# Patient Record
Sex: Female | Born: 1965
Health system: Southern US, Community
[De-identification: ages and names within clinical notes are randomized; demographics above are authoritative.]

## PROBLEM LIST (undated history)

## (undated) DIAGNOSIS — D134 Benign neoplasm of liver: Secondary | ICD-10-CM

## (undated) DIAGNOSIS — K8020 Calculus of gallbladder without cholecystitis without obstruction: Secondary | ICD-10-CM

## (undated) HISTORY — DX: Calculus of gallbladder without cholecystitis without obstruction: K80.20

## (undated) HISTORY — DX: Benign neoplasm of liver: D13.4

## (undated) HISTORY — PX: LIVER BIOPSY: SHX301

---

## 1997-07-26 ENCOUNTER — Inpatient Hospital Stay (HOSPITAL_COMMUNITY): Admission: AD | Admit: 1997-07-26 | Discharge: 1997-07-28 | Payer: Self-pay | Admitting: Obstetrics and Gynecology

## 2000-06-03 ENCOUNTER — Inpatient Hospital Stay (HOSPITAL_COMMUNITY): Admission: AD | Admit: 2000-06-03 | Discharge: 2000-06-05 | Payer: Self-pay | Admitting: Obstetrics and Gynecology

## 2000-07-11 ENCOUNTER — Other Ambulatory Visit: Admission: RE | Admit: 2000-07-11 | Discharge: 2000-07-11 | Payer: Self-pay | Admitting: Obstetrics and Gynecology

## 2001-07-16 ENCOUNTER — Other Ambulatory Visit: Admission: RE | Admit: 2001-07-16 | Discharge: 2001-07-16 | Payer: Self-pay | Admitting: Obstetrics and Gynecology

## 2002-08-12 ENCOUNTER — Other Ambulatory Visit: Admission: RE | Admit: 2002-08-12 | Discharge: 2002-08-12 | Payer: Self-pay | Admitting: Obstetrics and Gynecology

## 2003-09-17 ENCOUNTER — Other Ambulatory Visit: Admission: RE | Admit: 2003-09-17 | Discharge: 2003-09-17 | Payer: Self-pay | Admitting: Obstetrics and Gynecology

## 2004-11-03 ENCOUNTER — Other Ambulatory Visit: Admission: RE | Admit: 2004-11-03 | Discharge: 2004-11-03 | Payer: Self-pay | Admitting: Obstetrics and Gynecology

## 2015-05-05 ENCOUNTER — Other Ambulatory Visit: Payer: Self-pay | Admitting: Family Medicine

## 2015-05-05 DIAGNOSIS — R599 Enlarged lymph nodes, unspecified: Secondary | ICD-10-CM

## 2015-05-06 ENCOUNTER — Ambulatory Visit
Admission: RE | Admit: 2015-05-06 | Discharge: 2015-05-06 | Disposition: A | Discharge status: Home / Self Care | Payer: 59 | Source: Ambulatory Visit | Attending: Family Medicine | Admitting: Family Medicine

## 2015-05-06 DIAGNOSIS — R599 Enlarged lymph nodes, unspecified: Secondary | ICD-10-CM

## 2015-05-06 MED ORDER — IOPAMIDOL (ISOVUE-300) INJECTION 61%
75.0000 mL | Freq: Once | INTRAVENOUS | Status: AC | PRN
  Administered 2015-05-06: 75 mL via INTRAVENOUS

## 2016-04-25 DIAGNOSIS — H43812 Vitreous degeneration, left eye: Secondary | ICD-10-CM | POA: Diagnosis not present

## 2016-06-15 DIAGNOSIS — Z01419 Encounter for gynecological examination (general) (routine) without abnormal findings: Secondary | ICD-10-CM | POA: Diagnosis not present

## 2016-06-15 DIAGNOSIS — Z1231 Encounter for screening mammogram for malignant neoplasm of breast: Secondary | ICD-10-CM | POA: Diagnosis not present

## 2016-06-15 DIAGNOSIS — Z6824 Body mass index (BMI) 24.0-24.9, adult: Secondary | ICD-10-CM | POA: Diagnosis not present

## 2016-12-28 HISTORY — PX: COLONOSCOPY: SHX174

## 2017-01-02 DIAGNOSIS — D2230 Melanocytic nevi of unspecified part of face: Secondary | ICD-10-CM | POA: Diagnosis not present

## 2017-01-02 DIAGNOSIS — Z86018 Personal history of other benign neoplasm: Secondary | ICD-10-CM | POA: Diagnosis not present

## 2017-01-02 DIAGNOSIS — D225 Melanocytic nevi of trunk: Secondary | ICD-10-CM | POA: Diagnosis not present

## 2017-01-16 DIAGNOSIS — Z1211 Encounter for screening for malignant neoplasm of colon: Secondary | ICD-10-CM | POA: Diagnosis not present

## 2017-01-16 DIAGNOSIS — Z8379 Family history of other diseases of the digestive system: Secondary | ICD-10-CM | POA: Diagnosis not present

## 2017-04-02 DIAGNOSIS — J988 Other specified respiratory disorders: Secondary | ICD-10-CM | POA: Diagnosis not present

## 2017-04-02 DIAGNOSIS — R509 Fever, unspecified: Secondary | ICD-10-CM | POA: Diagnosis not present

## 2017-04-27 DIAGNOSIS — B9689 Other specified bacterial agents as the cause of diseases classified elsewhere: Secondary | ICD-10-CM | POA: Diagnosis not present

## 2017-04-27 DIAGNOSIS — J329 Chronic sinusitis, unspecified: Secondary | ICD-10-CM | POA: Diagnosis not present

## 2017-04-27 DIAGNOSIS — R05 Cough: Secondary | ICD-10-CM | POA: Diagnosis not present

## 2017-07-02 DIAGNOSIS — Z01419 Encounter for gynecological examination (general) (routine) without abnormal findings: Secondary | ICD-10-CM | POA: Diagnosis not present

## 2017-07-02 DIAGNOSIS — Z6823 Body mass index (BMI) 23.0-23.9, adult: Secondary | ICD-10-CM | POA: Diagnosis not present

## 2017-07-02 DIAGNOSIS — Z1231 Encounter for screening mammogram for malignant neoplasm of breast: Secondary | ICD-10-CM | POA: Diagnosis not present

## 2019-03-27 ENCOUNTER — Ambulatory Visit: Payer: 59 | Attending: Internal Medicine

## 2019-03-27 DIAGNOSIS — Z20822 Contact with and (suspected) exposure to covid-19: Secondary | ICD-10-CM

## 2019-03-28 LAB — NOVEL CORONAVIRUS, NAA: SARS-CoV-2, NAA: NOT DETECTED

## 2019-05-24 ENCOUNTER — Ambulatory Visit: Payer: 59 | Attending: Internal Medicine

## 2019-05-24 DIAGNOSIS — Z23 Encounter for immunization: Secondary | ICD-10-CM

## 2019-05-24 NOTE — Progress Notes (Signed)
   Covid-19 Vaccination Clinic  Name:  Tiffany Myers    MRN: [id number] DOB: [birth year]  05/24/2019  [name] was observed post Covid-19 immunization for 15 minutes without incident. She was provided with Vaccine Information Sheet and instruction to access the V-Safe system.   [name] was instructed to call 911 with any severe reactions post vaccine: Marland Kitchen Difficulty breathing  . Swelling of face and throat  . A fast heartbeat  . A bad rash all over body  . Dizziness and weakness   Immunizations Administered    Name Date Dose VIS Date Route   Pfizer COVID-19 Vaccine 05/24/2019  4:19 PM 0.3 mL 02/07/2019 Intramuscular   Manufacturer: [place]   Lot: [id number]   [place]: [id number]

## 2019-06-18 ENCOUNTER — Ambulatory Visit: Payer: 59 | Attending: Internal Medicine

## 2019-06-18 DIAGNOSIS — Z23 Encounter for immunization: Secondary | ICD-10-CM

## 2019-06-18 NOTE — Progress Notes (Signed)
   Covid-19 Vaccination Clinic  Name:  Tiffany Myers    MRN: [id number] DOB: 04/10/1965  06/18/2019  [name] was observed post Covid-19 immunization for 15 minutes without incident. She was provided with Vaccine Information Sheet and instruction to access the V-Safe system.   [name] was instructed to call 911 with any severe reactions post vaccine: Marland Kitchen Difficulty breathing  . Swelling of face and throat  . A fast heartbeat  . A bad rash all over body  . Dizziness and weakness   Immunizations Administered    Name Date Dose VIS Date Route   Pfizer COVID-19 Vaccine 06/18/2019  3:24 PM 0.3 mL 04/23/2018 Intramuscular   Manufacturer: [place]   Lot: [id number]   [place]: [id number]

## 2020-03-29 ENCOUNTER — Other Ambulatory Visit: Payer: Self-pay | Admitting: Internal Medicine

## 2020-03-29 DIAGNOSIS — M25661 Stiffness of right knee, not elsewhere classified: Secondary | ICD-10-CM

## 2020-03-29 DIAGNOSIS — M25662 Stiffness of left knee, not elsewhere classified: Secondary | ICD-10-CM

## 2020-03-29 DIAGNOSIS — R748 Abnormal levels of other serum enzymes: Secondary | ICD-10-CM

## 2020-03-29 DIAGNOSIS — M8588 Other specified disorders of bone density and structure, other site: Secondary | ICD-10-CM

## 2020-04-01 ENCOUNTER — Ambulatory Visit
Admission: RE | Admit: 2020-04-01 | Discharge: 2020-04-01 | Disposition: A | Discharge status: Home / Self Care | Payer: 59 | Source: Ambulatory Visit | Attending: Internal Medicine | Admitting: Internal Medicine

## 2020-04-01 DIAGNOSIS — M25662 Stiffness of left knee, not elsewhere classified: Secondary | ICD-10-CM

## 2020-04-01 DIAGNOSIS — M25661 Stiffness of right knee, not elsewhere classified: Secondary | ICD-10-CM

## 2020-04-01 DIAGNOSIS — M8588 Other specified disorders of bone density and structure, other site: Secondary | ICD-10-CM

## 2020-04-01 DIAGNOSIS — R748 Abnormal levels of other serum enzymes: Secondary | ICD-10-CM

## 2020-04-13 DIAGNOSIS — R748 Abnormal levels of other serum enzymes: Secondary | ICD-10-CM | POA: Diagnosis not present

## 2020-04-13 DIAGNOSIS — M85851 Other specified disorders of bone density and structure, right thigh: Secondary | ICD-10-CM | POA: Diagnosis not present

## 2020-04-13 DIAGNOSIS — M85852 Other specified disorders of bone density and structure, left thigh: Secondary | ICD-10-CM | POA: Diagnosis not present

## 2020-04-13 DIAGNOSIS — M8588 Other specified disorders of bone density and structure, other site: Secondary | ICD-10-CM | POA: Diagnosis not present

## 2020-04-13 DIAGNOSIS — R197 Diarrhea, unspecified: Secondary | ICD-10-CM | POA: Diagnosis not present

## 2020-05-25 ENCOUNTER — Other Ambulatory Visit: Payer: Self-pay | Admitting: Internal Medicine

## 2020-05-25 DIAGNOSIS — R748 Abnormal levels of other serum enzymes: Secondary | ICD-10-CM

## 2020-06-15 ENCOUNTER — Ambulatory Visit
Admission: RE | Admit: 2020-06-15 | Discharge: 2020-06-15 | Disposition: A | Discharge status: Home / Self Care | Payer: BC Managed Care – PPO | Source: Ambulatory Visit | Attending: Internal Medicine | Admitting: Internal Medicine

## 2020-06-15 DIAGNOSIS — K8020 Calculus of gallbladder without cholecystitis without obstruction: Secondary | ICD-10-CM | POA: Diagnosis not present

## 2020-06-15 DIAGNOSIS — R748 Abnormal levels of other serum enzymes: Secondary | ICD-10-CM

## 2020-06-21 ENCOUNTER — Other Ambulatory Visit: Payer: Self-pay | Admitting: Internal Medicine

## 2020-06-21 DIAGNOSIS — R160 Hepatomegaly, not elsewhere classified: Secondary | ICD-10-CM

## 2020-06-21 DIAGNOSIS — R935 Abnormal findings on diagnostic imaging of other abdominal regions, including retroperitoneum: Secondary | ICD-10-CM

## 2020-06-21 DIAGNOSIS — R748 Abnormal levels of other serum enzymes: Secondary | ICD-10-CM

## 2020-06-25 ENCOUNTER — Ambulatory Visit
Admission: RE | Admit: 2020-06-25 | Discharge: 2020-06-25 | Disposition: A | Discharge status: Home / Self Care | Payer: BC Managed Care – PPO | Source: Ambulatory Visit | Attending: Internal Medicine | Admitting: Internal Medicine

## 2020-06-25 ENCOUNTER — Other Ambulatory Visit: Payer: Self-pay

## 2020-06-25 DIAGNOSIS — R160 Hepatomegaly, not elsewhere classified: Secondary | ICD-10-CM

## 2020-06-25 DIAGNOSIS — R748 Abnormal levels of other serum enzymes: Secondary | ICD-10-CM

## 2020-06-25 DIAGNOSIS — N289 Disorder of kidney and ureter, unspecified: Secondary | ICD-10-CM | POA: Diagnosis not present

## 2020-06-25 DIAGNOSIS — R935 Abnormal findings on diagnostic imaging of other abdominal regions, including retroperitoneum: Secondary | ICD-10-CM

## 2020-06-25 DIAGNOSIS — K7689 Other specified diseases of liver: Secondary | ICD-10-CM | POA: Diagnosis not present

## 2020-06-25 DIAGNOSIS — D734 Cyst of spleen: Secondary | ICD-10-CM | POA: Diagnosis not present

## 2020-06-25 DIAGNOSIS — R7989 Other specified abnormal findings of blood chemistry: Secondary | ICD-10-CM | POA: Diagnosis not present

## 2020-06-25 MED ORDER — GADOBENATE DIMEGLUMINE 529 MG/ML IV SOLN
10.0000 mL | Freq: Once | INTRAVENOUS | Status: AC | PRN
  Administered 2020-06-25: 10 mL via INTRAVENOUS

## 2020-06-28 ENCOUNTER — Other Ambulatory Visit (HOSPITAL_COMMUNITY): Payer: Self-pay | Admitting: Internal Medicine

## 2020-06-28 ENCOUNTER — Other Ambulatory Visit: Payer: Self-pay | Admitting: Internal Medicine

## 2020-06-28 ENCOUNTER — Ambulatory Visit
Admission: RE | Admit: 2020-06-28 | Discharge: 2020-06-28 | Disposition: A | Discharge status: Home / Self Care | Payer: BC Managed Care – PPO | Source: Ambulatory Visit | Attending: Internal Medicine | Admitting: Internal Medicine

## 2020-06-28 DIAGNOSIS — R932 Abnormal findings on diagnostic imaging of liver and biliary tract: Secondary | ICD-10-CM

## 2020-06-28 DIAGNOSIS — R059 Cough, unspecified: Secondary | ICD-10-CM

## 2020-06-28 DIAGNOSIS — R748 Abnormal levels of other serum enzymes: Secondary | ICD-10-CM

## 2020-07-05 ENCOUNTER — Ambulatory Visit (HOSPITAL_COMMUNITY)
Admission: RE | Admit: 2020-07-05 | Discharge: 2020-07-05 | Disposition: A | Discharge status: Home / Self Care | Payer: BC Managed Care – PPO | Source: Ambulatory Visit | Attending: Internal Medicine | Admitting: Internal Medicine

## 2020-07-05 ENCOUNTER — Other Ambulatory Visit: Payer: BC Managed Care – PPO

## 2020-07-05 ENCOUNTER — Other Ambulatory Visit: Payer: Self-pay

## 2020-07-05 DIAGNOSIS — R932 Abnormal findings on diagnostic imaging of liver and biliary tract: Secondary | ICD-10-CM | POA: Diagnosis not present

## 2020-07-05 DIAGNOSIS — K314 Gastric diverticulum: Secondary | ICD-10-CM | POA: Diagnosis not present

## 2020-07-05 DIAGNOSIS — D7389 Other diseases of spleen: Secondary | ICD-10-CM | POA: Diagnosis not present

## 2020-07-05 DIAGNOSIS — R059 Cough, unspecified: Secondary | ICD-10-CM

## 2020-07-05 DIAGNOSIS — R748 Abnormal levels of other serum enzymes: Secondary | ICD-10-CM | POA: Diagnosis not present

## 2020-07-05 DIAGNOSIS — K7689 Other specified diseases of liver: Secondary | ICD-10-CM | POA: Diagnosis not present

## 2020-07-05 DIAGNOSIS — I700 Atherosclerosis of aorta: Secondary | ICD-10-CM | POA: Diagnosis not present

## 2020-07-05 DIAGNOSIS — K5790 Diverticulosis of intestine, part unspecified, without perforation or abscess without bleeding: Secondary | ICD-10-CM | POA: Diagnosis not present

## 2020-07-05 DIAGNOSIS — R160 Hepatomegaly, not elsewhere classified: Secondary | ICD-10-CM | POA: Diagnosis not present

## 2020-07-05 MED ORDER — SODIUM CHLORIDE (PF) 0.9 % IJ SOLN
INTRAMUSCULAR | Status: AC
  Filled 2020-07-05: qty 50

## 2020-07-05 MED ORDER — IOHEXOL 300 MG/ML  SOLN
100.0000 mL | Freq: Once | INTRAMUSCULAR | Status: AC | PRN
  Administered 2020-07-05: 100 mL via INTRAVENOUS

## 2020-07-07 ENCOUNTER — Ambulatory Visit (HOSPITAL_COMMUNITY): Payer: BC Managed Care – PPO

## 2020-07-08 DIAGNOSIS — R160 Hepatomegaly, not elsewhere classified: Secondary | ICD-10-CM | POA: Diagnosis not present

## 2020-07-08 DIAGNOSIS — R748 Abnormal levels of other serum enzymes: Secondary | ICD-10-CM | POA: Diagnosis not present

## 2020-07-14 ENCOUNTER — Other Ambulatory Visit (HOSPITAL_COMMUNITY): Payer: Self-pay | Admitting: Internal Medicine

## 2020-07-14 ENCOUNTER — Encounter (HOSPITAL_COMMUNITY): Payer: Self-pay

## 2020-07-14 DIAGNOSIS — R160 Hepatomegaly, not elsewhere classified: Secondary | ICD-10-CM

## 2020-07-14 NOTE — Progress Notes (Unsigned)
AD           Hessie Dibble. [place] Female, 55 y.o., [birth year]  MRN:  [id number] Phone:  [phone] Jerilynn Mages)       PCP:  [name], MD Primary Cvg:  [employer]            RE: Biopsy Received: Today  Message Details  Arne Cleveland, MD  Ernestene Mention   [place] core liver lesion  mets v HCCa   DDH    Previous Messages  ----- Message -----  From: Lenore Cordia  Sent: 07/14/2020 11:45 AM EDT  To: Ir Procedure Requests  Subject: Biopsy                      Procedure Requested: US Biopsy (Liver)    Reason for Procedure: Liver Masses    Provider Requesting: Katha Cabal  Provider Telephone: [phone]    Other Info:

## 2020-07-15 DIAGNOSIS — R160 Hepatomegaly, not elsewhere classified: Secondary | ICD-10-CM | POA: Diagnosis not present

## 2020-07-16 ENCOUNTER — Inpatient Hospital Stay: Payer: BC Managed Care – PPO | Attending: Oncology | Admitting: Oncology

## 2020-07-16 ENCOUNTER — Other Ambulatory Visit: Payer: Self-pay

## 2020-07-16 ENCOUNTER — Telehealth: Payer: Self-pay | Admitting: Oncology

## 2020-07-16 VITALS — BP 144/78 | HR 91 | Temp 98.5°F | Resp 18 | Ht 62.0 in | Wt 119.8 lb

## 2020-07-16 DIAGNOSIS — Z85118 Personal history of other malignant neoplasm of bronchus and lung: Secondary | ICD-10-CM | POA: Diagnosis not present

## 2020-07-16 DIAGNOSIS — Z8616 Personal history of COVID-19: Secondary | ICD-10-CM | POA: Insufficient documentation

## 2020-07-16 DIAGNOSIS — D7289 Other specified disorders of white blood cells: Secondary | ICD-10-CM | POA: Insufficient documentation

## 2020-07-16 DIAGNOSIS — Z800 Family history of malignant neoplasm of digestive organs: Secondary | ICD-10-CM

## 2020-07-16 DIAGNOSIS — D72828 Other elevated white blood cell count: Secondary | ICD-10-CM

## 2020-07-16 DIAGNOSIS — Z807 Family history of other malignant neoplasms of lymphoid, hematopoietic and related tissues: Secondary | ICD-10-CM | POA: Insufficient documentation

## 2020-07-16 DIAGNOSIS — R160 Hepatomegaly, not elsewhere classified: Secondary | ICD-10-CM

## 2020-07-16 DIAGNOSIS — Z803 Family history of malignant neoplasm of breast: Secondary | ICD-10-CM

## 2020-07-16 DIAGNOSIS — Z801 Family history of malignant neoplasm of trachea, bronchus and lung: Secondary | ICD-10-CM | POA: Insufficient documentation

## 2020-07-16 NOTE — Progress Notes (Signed)
[facility] New Patient Consult   Requesting MD: [name], [place],  [place] [address]   Tiffany Myers 55 y.o.  December 19, 1965    Reason for Consult: Multiple liver masses   HPI: Tiffany Myers reports the alkaline phosphatase was elevated when she saw her gynecologist in 2020.  She was referred to Dr. Lindell Noe and says a repeat alkaline phosphatase was normal.  The alkaline phosphatase was again elevated in 2021 and she was referred to Dr. Buddy Duty. A chemistry panel on 12/09/2019 found the alkaline phosphatase at 134 (38-126) with normal AST, ALT, and bilirubin levels.  The GGT also returned in the normal range.  A metastatic bone survey on 04/01/2020 was negative for lytic lesions. She was referred for an abdominal ultrasound on 06/15/2020.  This revealed an ill-defined mass in the left liver measuring 3.5 x 3 x 3.6 cm. An MRI of the abdomen on 06/25/2020 revealed innumerable hepatic lesions throughout both lobes.  The lesions have increased T2 signal and are difficult to see on the T1 images.  They are weakly diffusion positive with contrast enhancement and gradual fading on the delayed images.  The findings were suspicious for diffuse hepatic metastases.  The gallbladder is unremarkable.  No pancreas mass.  The adrenal glands and kidneys appear normal.  The stomach, duodenum, and visualized bowel appear normal.  No mesenteric or retroperitoneal adenopathy.  No ascites.  She was referred for CTs of the chest, abdomen, and pelvis on 07/05/2020.  Numerous hyperdense and mildly heterogenous lesions were noted in the liver measuring up to 3.3 x 4.9 cm in the left hepatic lobe.  The liver is enlarged.  Probable sclerotic bone islands in the L4 vertebra and sacrum.  No lytic lesions.  No evidence of a primary tumor site.  She is scheduled for a liver biopsy on 07/20/2020.  Tiffany Myers had a negative colonoscopy in November 2018.  A mammogram on 08/07/2019  was negative.   Past medical history: 1.  G2 P2, monthly menses, maintained on oral contraceptives for the past 35 years 2.  COVID-19 infection January 2022  Past surgical history: None   Medications: Reviewed  Allergies:  Allergies  Allergen Reactions  . Doxycycline Rash  . Penicillins Rash  . Sulfa Antibiotics Rash    Family history: Her mother had lymphoma at age 82 and lung cancer at age 47.  Her maternal grandfather had pancreas cancer at age 24.  Maternal aunts had breast cancer  Social History:   She lives in [place].  She works in a Catering manager.  She does not use cigarettes.  She has 1 to 2 glasses of wine a few nights per week.  No transfusion history.  No risk factor for HIV or hepatitis.  She has received COVID-19 vaccines.  ROS:   Positives include: Night sweats 3-4 times over the past week, mild discomfort in the right lower abdomen with her menstrual cycle, anorexia since she was found to have liver lesions  A complete ROS was otherwise negative.  Physical Exam:  Blood pressure (!) 144/78, pulse 91, temperature 98.5 F (36.9 C), temperature source Oral, resp. rate 18, height 5\' 2"  (1.575 m), weight 119 lb 12.8 oz (54.3 kg), last menstrual period 06/21/2020, SpO2 100 %.  Lungs: Clear bilaterally Cardiac: Regular rate and rhythm Abdomen: Nontender, no hepatosplenomegaly, no mass, no apparent ascites Breast: Bilateral breast without mass Vascular: No leg edema Lymph nodes: No cervical, supraclavicular, axillary, or inguinal  nodes Neurologic: Alert and oriented, the motor exam appears intact in the upper and lower extremities bilaterally Skin: Multiple benign-appearing moles over the trunk Musculoskeletal: No spine tenderness   LAB:  CBC 07/08/2020 at [facility]: Hemoglobin 12.9, MCV 89.2, platelets 367,000, WBC 16.8, ANC 14.5, absolute lymphocyte count 1.7, PT/INR 0.9     Imaging: CT 07/05/2020 and MRI 06/25/2020-images reviewed with  Tiffany Myers and her husband    Assessment/Plan:   1. Multiple liver masses  06/15/2020-right upper quadrant ultrasound, left liver mass, cholelithiasis  MRI abdomen 06/25/2020- innumerable hepatic lesions suspicious for metastatic disease, lesions demonstrate increased T2 signal, weak diffusion positivity, contrast-enhancement with gradual fading on delayed images  CTs chest, abdomen, and pelvis on 07/06/2018-no evidence of primary malignancy, numerous hyperdense and mildly heterogenous liver lesions 2. G2 P2, maintained on oral contraceptives for 35 years 3. Mild neutrophilia   Disposition:   Tiffany Myers is referred for oncology evaluation after imaging studies have identified multiple liver lesions.  The imaging was prompted by elevation of the alkaline phosphatase.  I discussed the differential diagnosis with Tiffany Myers and her husband.  This includes metastatic carcinoma, carcinoid tumor, multifocal hepatic adenomas, hepatocellular carcinoma, and benign etiologies.  There is suspicion for multiple hepatic metastases, but there is no evidence for a primary tumor site.  She does not have symptoms suggestive of an advanced malignancy.  I discussed the case with radiology.  Hepatic adenomas are possible, but not favored.  She is scheduled for a diagnostic liver biopsy on 07/20/2020.  She will return for an office visit on 07/23/2020.  We will initiate additional diagnostic evaluation and a treatment plan based on the biopsy result.  Betsy Coder, MD  07/16/2020, 3:17 PM

## 2020-07-16 NOTE — Telephone Encounter (Signed)
Received an urgent referral from Dr. Buddy Duty for an evaluation of liver masses. Tiffany Myers has been cld and scheduled to see Dr. Benay Spice today at 1:40pm. She's aware to arrive 15 minutes early.

## 2020-07-19 ENCOUNTER — Other Ambulatory Visit: Payer: Self-pay | Admitting: Radiology

## 2020-07-19 ENCOUNTER — Telehealth: Payer: Self-pay | Admitting: *Deleted

## 2020-07-19 ENCOUNTER — Other Ambulatory Visit: Payer: Self-pay | Admitting: Student

## 2020-07-19 NOTE — Telephone Encounter (Signed)
Dr. Benay Spice had discussed w/patient on Friday that further review of images w/radiologist determined that liver lesions appear to be hepatic adenomas related to birth control pills. Called to f/u on her decision regarding following these. 1. Stop birth control pills, return in 1 month w/CBC and CMP, and then repeat MRI 1 month later.  2. Proceed w/biopsy and see MD as scheduled. Patient prefers to have biopsy and see MD as scheduled. She did stop birth control pills (was due to begin today and she did not). MD notified.

## 2020-07-20 ENCOUNTER — Encounter (HOSPITAL_COMMUNITY): Payer: Self-pay

## 2020-07-20 ENCOUNTER — Ambulatory Visit (HOSPITAL_COMMUNITY)
Admission: RE | Admit: 2020-07-20 | Discharge: 2020-07-20 | Disposition: A | Discharge status: Home / Self Care | Payer: BC Managed Care – PPO | Source: Ambulatory Visit | Attending: Internal Medicine | Admitting: Internal Medicine

## 2020-07-20 ENCOUNTER — Other Ambulatory Visit: Payer: Self-pay

## 2020-07-20 DIAGNOSIS — Z79899 Other long term (current) drug therapy: Secondary | ICD-10-CM | POA: Diagnosis not present

## 2020-07-20 DIAGNOSIS — R160 Hepatomegaly, not elsewhere classified: Secondary | ICD-10-CM | POA: Insufficient documentation

## 2020-07-20 DIAGNOSIS — C220 Liver cell carcinoma: Secondary | ICD-10-CM | POA: Diagnosis not present

## 2020-07-20 DIAGNOSIS — K7689 Other specified diseases of liver: Secondary | ICD-10-CM | POA: Diagnosis not present

## 2020-07-20 DIAGNOSIS — D134 Benign neoplasm of liver: Secondary | ICD-10-CM | POA: Insufficient documentation

## 2020-07-20 LAB — CBC
HCT: 41.6 % (ref 36.0–46.0)
Hemoglobin: 13.6 g/dL (ref 12.0–15.0)
MCH: 29.8 pg (ref 26.0–34.0)
MCHC: 32.7 g/dL (ref 30.0–36.0)
MCV: 91.2 fL (ref 80.0–100.0)
Platelets: 408 10*3/uL — ABNORMAL HIGH (ref 150–400)
RBC: 4.56 MIL/uL (ref 3.87–5.11)
RDW: 13.2 % (ref 11.5–15.5)
WBC: 5.9 10*3/uL (ref 4.0–10.5)
nRBC: 0 % (ref 0.0–0.2)

## 2020-07-20 LAB — PROTIME-INR
INR: 1 (ref 0.8–1.2)
Prothrombin Time: 12.9 seconds (ref 11.4–15.2)

## 2020-07-20 MED ORDER — MIDAZOLAM HCL 2 MG/2ML IJ SOLN
INTRAMUSCULAR | Status: AC
  Filled 2020-07-20: qty 2

## 2020-07-20 MED ORDER — GELATIN ABSORBABLE 12-7 MM EX MISC
CUTANEOUS | Status: AC
  Filled 2020-07-20: qty 1

## 2020-07-20 MED ORDER — LIDOCAINE HCL (PF) 1 % IJ SOLN
INTRAMUSCULAR | Status: AC
  Filled 2020-07-20: qty 30

## 2020-07-20 MED ORDER — FENTANYL CITRATE (PF) 100 MCG/2ML IJ SOLN
INTRAMUSCULAR | Status: AC
  Filled 2020-07-20: qty 2

## 2020-07-20 MED ORDER — MIDAZOLAM HCL 2 MG/2ML IJ SOLN
INTRAMUSCULAR | Status: AC | PRN
  Administered 2020-07-20: 1 mg via INTRAVENOUS

## 2020-07-20 MED ORDER — SODIUM CHLORIDE 0.9 % IV SOLN: INTRAVENOUS | Status: DC

## 2020-07-20 MED ORDER — FENTANYL CITRATE (PF) 100 MCG/2ML IJ SOLN
INTRAMUSCULAR | Status: AC | PRN
  Administered 2020-07-20: 50 ug via INTRAVENOUS

## 2020-07-20 NOTE — Procedures (Signed)
Interventional Radiology Procedure Note  Procedure: US guided biopsy of left liver mass.  Mx 91A core Complications: None EBL: None Recommendations: - Bedrest 2 hours.   - Routine wound care - Follow up pathology - Advance diet   Signed,  Corrie Mckusick, DO

## 2020-07-20 NOTE — H&P (Signed)
Chief Complaint: Patient was seen in consultation today for liver lesion biopsy at the request of [place]  Referring Physician(s): Kerr,Jeffrey Dr Julieanne Manson  Supervising Physician: Corrie Mckusick  Patient Status: [facility] - Out-pt  History of Present Illness: Tiffany Myers is a 55 y.o. female   Pt has been aware of elevated Alk Phosphatase blood work for 2 years Labs have been up and down  Denies pain; wt loss; jaundice Work up for same included US Liver   US 06/15/20  IMPRESSION: 1. Likely 3.6 cm left lobe liver mass, for which dedicated liver MRI is recommended for complete characterization. 2. Cholelithiasis without cholecystitis.  MRI 4/29: IMPRESSION: 1. Innumerable hepatic lesions highly suspicious for diffuse hepatic metastatic disease. 2. No obvious primary lesion is identified. Breast cancer, lung cancer and colon cancer would be considerations. Considerations would include biopsy, PET-CT and CT chest, abdomen/pelvis. 3. No abdominal lymphadenopathy.  Pt does take Birth control pills daily Question was raised - could be hepatic adenomas  MRI IMPRESSION: Addendum 07/16/20 Given the patient's history of being very healthy with no known primary cancer and long history of birth control pill usage I think it is possible that these multiple liver lesions could be hepatic adenomas. They are difficult to identify on the T1 weighted images and may have some slight increased T1 signal intensity on the precontrast fat saturated series. They do not washout quickly but do fade gradually on the delayed post-contrast images. They are also equivocal on the diffusion-weighted images.  Pt has discussed findings with Dr Buddy Duty and Dr Benay Spice Decision has been made to move ahead with biopsy today She has discontinued birth control pills now.  History reviewed. No pertinent past medical history.  History reviewed. No pertinent surgical  history.  Allergies: Doxycycline, Penicillins, and Sulfa antibiotics  Medications: Prior to Admission medications   Medication Sig Start Date End Date Taking? Authorizing Provider  Calcium Carb-Cholecalciferol (CALCIUM 600 + D PO) Take 2 tablets by mouth daily.   Yes [provider]  Cholecalciferol (VITAMIN D) 50 MCG (2000 UT) tablet Take 2,000 Units by mouth daily.   Yes [provider]  fluticasone (FLONASE) 50 MCG/ACT nasal spray Place 1 spray into both nostrils daily as needed for allergies or rhinitis.   Yes [provider]  ibuprofen (ADVIL) 200 MG tablet Take 400 mg by mouth every 6 (six) hours as needed for headache or moderate pain.   Yes [provider]  norethindrone-ethinyl estradiol 1/35 (ORTHO-NOVUM) tablet Take 1 tablet by mouth daily.   Yes [provider]     Family History  Problem Relation Age of Onset  . Cirrhosis Father     Social History   Socioeconomic History  . Marital status: Married    Spouse name: Not on file  . Number of children: Not on file  . Years of education: Not on file  . Highest education level: Not on file  Occupational History  . Not on file  Tobacco Use  . Smoking status: Never Smoker  . Smokeless tobacco: Never Used  Vaping Use  . Vaping Use: Never used  Substance and Sexual Activity  . Alcohol use: Yes    Comment: social  . Drug use: Never  . Sexual activity: Not on file  Other Topics Concern  . Not on file  Social History Narrative  . Not on file   Social Determinants of Health   Financial Resource Strain: Not on file  Food Insecurity: Not on file  Transportation Needs: Not on file  Physical Activity: Not on file  Stress: Not on file  Social Connections: Not on file     Review of Systems: A 12 point ROS discussed and pertinent positives are indicated in the HPI above.  All other systems are negative.  Review of Systems  Constitutional: Negative for activity change,  fatigue and fever.  Respiratory: Negative for cough and shortness of breath.   Cardiovascular: Negative for chest pain.  Gastrointestinal: Negative for abdominal pain and nausea.  Psychiatric/Behavioral: Negative for behavioral problems and confusion.    Vital Signs: BP (!) 141/91   Pulse 84   Temp 98.3 F (36.8 C) (Oral)   Resp 16   Ht '5\' 2"'  (1.575 m)   Wt 120 lb (54.4 kg)   LMP 06/16/2020 Comment: birth control pill  SpO2 100%   BMI 21.95 kg/m   Physical Exam Vitals reviewed.  HENT:     Mouth/Throat:     Mouth: Mucous membranes are moist.  Cardiovascular:     Rate and Rhythm: Normal rate and regular rhythm.     Heart sounds: Normal heart sounds.  Pulmonary:     Effort: Pulmonary effort is normal.     Breath sounds: Normal breath sounds.  Abdominal:     Palpations: Abdomen is soft.     Tenderness: There is no abdominal tenderness.  Musculoskeletal:        General: Normal range of motion.  Skin:    General: Skin is warm.  Neurological:     Mental Status: She is alert and oriented to person, place, and time.  Psychiatric:        Behavior: Behavior normal.     Imaging: DG Chest 2 View  Result Date: 06/29/2020 CLINICAL DATA:  Cough EXAM: CHEST - 2 VIEW COMPARISON:  None. FINDINGS: Lungs are clear.  No pleural effusion or pneumothorax. The heart is normal in size. Visualized osseous structures are within normal limits. IMPRESSION: Normal chest radiographs. Electronically Signed   By: Julian Hy M.D.   On: 06/29/2020 09:52   MR ABDOMEN WWO CONTRAST  Addendum Date: 07/16/2020   ADDENDUM REPORT: 07/16/2020 17:11 ADDENDUM: I reviewed this study with Dr. Julieanne Manson and have showed this examination to some of my colleagues. Given the patient's history of being very healthy with no known primary cancer and long history of birth control pill usage I think it is possible that these multiple liver lesions could be hepatic adenomas. They are difficult to identify on the  T1 weighted images and may have some slight increased T1 signal intensity on the precontrast fat saturated series. They do not washout quickly but do fade gradually on the delayed post-contrast images. They are also equivocal on the diffusion-weighted images. I think in this situation it may be prudent to repeat an MRI of the abdomen using Eovist contrast material in 3 months, after the patient has been off the birth control pills. Electronically Signed   By: Marijo Sanes M.D.   On: 07/16/2020 17:11   Result Date: 07/16/2020 CLINICAL DATA:  Elevated liver function studies. Abnormal ultrasound. EXAM: MRI ABDOMEN WITHOUT AND WITH CONTRAST TECHNIQUE: Multiplanar multisequence MR imaging of the abdomen was performed both before and after the administration of intravenous contrast. CONTRAST:  20m MULTIHANCE GADOBENATE DIMEGLUMINE 529 MG/ML IV SOLN COMPARISON:  Ultrasound 06/15/2020 FINDINGS: Lower chest: The lung bases are clear of an acute process. No pulmonary lesions or pleural effusion. No pericardial effusion. Hepatobiliary: There are innumerable hepatic lesions throughout  both lobes. These lesions demonstrate heterogeneous increased T2 signal intensity and are difficult to see on the T1 weighted images. A appear to be weakly diffusion positive. All of the lesions demonstrate contrast enhancement with gradual fading on the delayed images. Findings are highly suspicious for diffuse hepatic metastatic disease. Index lesions include: 4.9 x 2.8 cm lesion in segment 3 2.0 x 1.3 cm lesion in segment 5. 1.6 x 1.5 cm lesion near the right kidney in segment 6. 1.8 x 1.6 cm lesion in segment 6 more inferiorly. No obvious primary lesion is identified. Breast cancer, lung cancer and colon cancer would be considerations. PET-CT or CT scan of the chest, abdomen and pelvis may be helpful to assess for a primary lesion. No biliary dilatation. Gallbladder is unremarkable. No common bile duct dilatation. Pancreas:  No mass,  inflammation or ductal dilatation. Spleen:  Normal size.  Small splenic cyst noted anteriorly. Adrenals/Urinary Tract: The adrenal glands kidneys are normal. No renal lesions or hydronephrosis. Stomach/Bowel: The stomach, duodenum, visualized small bowel and visualized colon are grossly normal. Vascular/Lymphatic: The aorta and branch vessels are normal. The major venous structures are patent. No mesenteric or retroperitoneal mass or adenopathy. Other:  No ascites or abdominal wall hernia. Musculoskeletal: No significant bony findings. IMPRESSION: 1. Innumerable hepatic lesions highly suspicious for diffuse hepatic metastatic disease. 2. No obvious primary lesion is identified. Breast cancer, lung cancer and colon cancer would be considerations. Considerations would include biopsy, PET-CT and CT chest, abdomen/pelvis. 3. No abdominal lymphadenopathy. Electronically Signed: By: Marijo Sanes M.D. On: 06/25/2020 16:22   CT CHEST ABDOMEN PELVIS W CONTRAST  Result Date: 07/07/2020 CLINICAL DATA:  Possible hepatic metastatic disease. EXAM: CT CHEST, ABDOMEN, AND PELVIS WITH CONTRAST TECHNIQUE: Multidetector CT imaging of the chest, abdomen and pelvis was performed following the standard protocol during bolus administration of intravenous contrast. CONTRAST:  128m OMNIPAQUE IOHEXOL 300 MG/ML  SOLN COMPARISON:  MR abdomen 06/25/2020. FINDINGS: CT CHEST FINDINGS Cardiovascular: Heart size normal.  No pericardial effusion. Mediastinum/Nodes: No pathologically enlarged mediastinal, hilar or axillary lymph nodes. Esophagus is grossly unremarkable. Lungs/Pleura: Lungs are clear. No pleural fluid. Airway is unremarkable. Musculoskeletal: No worrisome lytic or sclerotic lesions. CT ABDOMEN PELVIS FINDINGS Hepatobiliary: There are numerous hyperdense and mildly heterogeneous lesions in the liver, measuring up to 3.3 x 4.9 cm in the left hepatic lobe (4/18). Liver is enlarged, 18.9 cm. Gallbladder is unremarkable. No biliary  ductal dilatation. Pancreas: Negative. Spleen: 10 mm low-attenuation lesion in the anterior spleen, likely a cyst. Adrenals/Urinary Tract: Adrenal glands and kidneys are unremarkable. Ureters are decompressed. Bladder is low in volume. Stomach/Bowel: Incidental note is made of a posterior gastric diverticulum. Stomach, small bowel, appendix and colon are otherwise unremarkable. Vascular/Lymphatic: Atherosclerotic calcification of the aorta. No pathologically enlarged lymph nodes. Reproductive: Uterus is visualized.  No adnexal mass. Other: No free fluid.  Mesenteries and peritoneum are unremarkable. Musculoskeletal: Probable sclerotic bone islands in the L4 vertebral body and sacrum. No lytic lesions. IMPRESSION: 1. No evidence of primary malignancy in the chest, abdomen or pelvis. 2. Heterogeneously hyperattenuating nodules and masses throughout the liver, better evaluated on MR abdomen 06/25/2020. 3. Mild hepatomegaly. 4.  Aortic atherosclerosis (ICD10-I70.0). Electronically Signed   By: MLorin PicketM.D.   On: 07/07/2020 08:17    Labs:  CBC: No results for input(s): WBC, HGB, HCT, PLT in the last 8760 hours.  COAGS: No results for input(s): INR, APTT in the last 8760 hours.  BMP: No results for input(s): NA, K, CL,  CO2, GLUCOSE, BUN, CALCIUM, CREATININE, GFRNONAA, GFRAA in the last 8760 hours.  Invalid input(s): CMP  LIVER FUNCTION TESTS: No results for input(s): BILITOT, AST, ALT, ALKPHOS, PROT, ALBUMIN in the last 8760 hours.  TUMOR MARKERS: No results for input(s): AFPTM, CEA, CA199, CHROMGRNA in the last 8760 hours.  Assessment and Plan:  Elevated alkaline phosphatase for 2 years Followed with Endocrinology-- labs up and down US revealing liver lesion MRI confirming liver lesions---addendum raising question of hepatic adenomas secondary use of birth control pills Pt and MDs have discussed and want to move ahead with biopsy today She has stopped BCP now  Risks and benefits of  liver lesion biopsy was discussed with the patient and/or patient's family including, but not limited to bleeding, infection, damage to adjacent structures or low yield requiring additional tests.  All of the questions were answered and there is agreement to proceed. Consent signed and in chart.    Thank you for this interesting consult.  I greatly enjoyed meeting ARMANI GAWLIK and look forward to participating in their care.  A copy of this report was sent to the requesting provider on this date.  Electronically Signed: Lavonia Drafts, PA-C 07/20/2020, 6:51 AM   I spent a total of  30 Minutes   in face to face in clinical consultation, greater than 50% of which was counseling/coordinating care for liver lesion bx

## 2020-07-20 NOTE — Sedation Documentation (Signed)
Attempted to call report so short stay.

## 2020-07-20 NOTE — Discharge Instructions (Addendum)
Moderate Conscious Sedation, Adult Sedation is the use of medicines to promote relaxation and to relieve discomfort and anxiety. Moderate conscious sedation is a type of sedation. Under moderate conscious sedation, you are less alert than normal, but you are still able to respond to instructions, touch, or both. Moderate conscious sedation is used during short medical and dental procedures. It is milder than deep sedation, which is a type of sedation under which you cannot be easily woken up. It is also milder than general anesthesia, which is the use of medicines to make you unconscious. Moderate conscious sedation allows you to return to your regular activities sooner. Tell a health care provider about:  Any allergies you have.  All medicines you are taking, including vitamins, herbs, eye drops, creams, and over-the-counter medicines.  Any use of steroids. This includes steroids taken by mouth or as a cream.  Any problems you or family members have had with sedatives and anesthetic medicines.  Any blood disorders you have.  Any surgeries you have had.  Any medical conditions you have, such as sleep apnea.  Whether you are pregnant or may be pregnant.  Any use of cigarettes, alcohol, marijuana, or drugs. What are the risks? Generally, this is a safe procedure. However, problems may occur, including:  Getting too much medicine (oversedation).  Nausea.  Allergic reaction to medicines.  Trouble breathing. If this happens, a breathing tube may be used. It will be removed when you are awake and breathing on your own.  Heart trouble.  Lung trouble.  Confusion that gets better with time (emergence delirium). What happens before the procedure? Staying hydrated Follow instructions from your health care provider about hydration, which may include:  Up to 2 hours before the procedure - you may continue to drink clear liquids, such as water, clear fruit juice, black coffee, and plain  tea. Eating and drinking restrictions Follow instructions from your health care provider about eating and drinking, which may include:  8 hours before the procedure - stop eating heavy meals or foods, such as meat, fried foods, or fatty foods.  6 hours before the procedure - stop eating light meals or foods, such as toast or cereal.  6 hours before the procedure - stop drinking milk or drinks that contain milk.  2 hours before the procedure - stop drinking clear liquids. Medicines Ask your health care provider about:  Changing or stopping your regular medicines. This is especially important if you are taking diabetes medicines or blood thinners.  Taking medicines such as aspirin and ibuprofen. These medicines can thin your blood. Do not take these medicines unless your health care provider tells you to take them.  Taking over-the-counter medicines, vitamins, herbs, and supplements. Tests and exams  You will have a physical exam.  You may have blood tests done to show how well: ? Your kidneys and liver work. ? Your blood clots. General instructions  Plan to have a responsible adult take you home from the hospital or clinic.  If you will be going home right after the procedure, plan to have a responsible adult care for you for the time you are told. This is important. What happens during the procedure?  You will be given the sedative. The sedative may be given: ? As a pill that you will swallow. It can also be inserted into the rectum. ? As a spray through the nose. ? As an injection into the muscle. ? As an injection into the vein through an IV.    You may be given oxygen as needed.  Your breathing, heart rate, and blood pressure will be monitored during the procedure.  The medical or dental procedure will be done. The procedure may vary among health care providers and hospitals.   What happens after the procedure?  Your blood pressure, heart rate, breathing rate, and  blood oxygen level will be monitored until you leave the hospital or clinic.  You will get fluids through your IV if needed.  Do not drive or operate machinery until your health care provider says that it is safe. Summary  Sedation is the use of medicines to promote relaxation and to relieve discomfort and anxiety. Moderate conscious sedation is a type of sedation that is used during short medical and dental procedures.  Tell the health care provider about any medical conditions that you have and about all the medicines that you are taking.  You will be given the sedative as a pill, a spray through the nose, an injection into the muscle, or an injection into the vein through an IV. Vital signs are monitored during the sedation.  Moderate conscious sedation allows you to return to your regular activities sooner. This information is not intended to replace advice given to you by your health care provider. Make sure you discuss any questions you have with your health care provider. Document Revised: 06/13/2019 Document Reviewed: 01/09/2019 Elsevier Patient Education  2021 Elsevier Inc.  

## 2020-07-21 ENCOUNTER — Encounter: Payer: Self-pay | Admitting: *Deleted

## 2020-07-21 LAB — SURGICAL PATHOLOGY

## 2020-07-21 NOTE — Progress Notes (Signed)
[facility] Psychosocial Distress Screening Clinical Social Work  Clinical Social Work was referred by distress screening protocol.  The patient scored a 9 on the Psychosocial Distress Thermometer which indicates severe distress. Clinical Social Worker contacted patient by phone to assess for distress and other psychosocial needs.  Patient stated she had a biopsy procedure yesterday, and was experiencing some anxiety while waiting on the results.  CSW and patient discussed the "fear of the unknown", common feelings and emotions when going through the diagnosis stage, and the importance of support.  CSW provided education on the patient and family support team and support programs at [facility].  Patient was appreciative of CSW contact, and agreeable to being added to the Support Programs monthly mailing list.  CSW provided contact information and encouraged patient to call with any questions or concerns.           ONCBCN DISTRESS SCREENING 07/16/2020  Screening Type Initial Screening  Distress experienced in past week (1-10) 9  Family Problem type Other (comment)  Emotional problem type Nervousness/Anxiety;Adjusting to illness;Isolation/feeling alone  Spiritual/Religous concerns type Facing my mortality  Information Concerns Type Lack of info about diagnosis;Lack of info about treatment;Lack of info about complementary therapy choices  Physical Problem type Loss of appetitie;Skin dry/itchy  Physician notified of physical symptoms Yes  Referral to clinical psychology No  Referral to clinical social work Yes  Referral to dietition No  Referral to financial advocate No  Referral to support programs No  Referral to palliative care No    Johnnye Lana, MSW, LCSW, OSW-C Clinical Social Worker [place] [phone]

## 2020-07-22 ENCOUNTER — Ambulatory Visit (HOSPITAL_COMMUNITY): Payer: BC Managed Care – PPO

## 2020-07-23 ENCOUNTER — Other Ambulatory Visit: Payer: Self-pay

## 2020-07-23 ENCOUNTER — Inpatient Hospital Stay (HOSPITAL_BASED_OUTPATIENT_CLINIC_OR_DEPARTMENT_OTHER): Payer: BC Managed Care – PPO | Admitting: Nurse Practitioner

## 2020-07-23 VITALS — BP 150/84 | HR 99 | Temp 97.8°F | Resp 20 | Ht 62.0 in | Wt 122.0 lb

## 2020-07-23 DIAGNOSIS — R160 Hepatomegaly, not elsewhere classified: Secondary | ICD-10-CM | POA: Diagnosis not present

## 2020-07-23 DIAGNOSIS — Z803 Family history of malignant neoplasm of breast: Secondary | ICD-10-CM | POA: Diagnosis not present

## 2020-07-23 DIAGNOSIS — Z800 Family history of malignant neoplasm of digestive organs: Secondary | ICD-10-CM | POA: Diagnosis not present

## 2020-07-23 DIAGNOSIS — D134 Benign neoplasm of liver: Secondary | ICD-10-CM

## 2020-07-23 DIAGNOSIS — Z801 Family history of malignant neoplasm of trachea, bronchus and lung: Secondary | ICD-10-CM | POA: Diagnosis not present

## 2020-07-23 DIAGNOSIS — D7289 Other specified disorders of white blood cells: Secondary | ICD-10-CM | POA: Diagnosis not present

## 2020-07-23 DIAGNOSIS — Z807 Family history of other malignant neoplasms of lymphoid, hematopoietic and related tissues: Secondary | ICD-10-CM | POA: Diagnosis not present

## 2020-07-23 DIAGNOSIS — Z8616 Personal history of COVID-19: Secondary | ICD-10-CM | POA: Diagnosis not present

## 2020-07-23 NOTE — Progress Notes (Addendum)
  [facility] OFFICE PROGRESS NOTE   Diagnosis: Multiple liver masses  INTERVAL HISTORY:   Tiffany Myers returns as scheduled.  She underwent biopsy of a liver lesion 07/20/2020.  Pathology shows hepatocellular adenoma.  She feels well.  No pain.  Objective:  Vital signs in last 24 hours:  Blood pressure (!) 150/84, pulse 99, temperature 97.8 F (36.6 C), temperature source Oral, resp. rate 20, height 5\' 2"  (1.575 m), weight 122 lb (55.3 kg), last menstrual period 06/21/2020, SpO2 100 %.    Resp: Lungs clear bilaterally.   Cardio: Regular rate and rhythm. GI: Abdomen soft and nontender.  No hepatomegaly. Vascular: No leg edema.  Calves soft and nontender.  Skin: Small resolving ecchymosis mid upper abdomen.   Lab Results:  Lab Results  Component Value Date   WBC 5.9 07/20/2020   HGB 13.6 07/20/2020   HCT 41.6 07/20/2020   MCV 91.2 07/20/2020   PLT 408 (H) 07/20/2020    Imaging:  No results found.  Medications: I have reviewed the patient's current medications.  Assessment/Plan: 1. Multiple liver masses ? 06/15/2020-right upper quadrant ultrasound, left liver mass, cholelithiasis ? MRI abdomen 06/25/2020- innumerable hepatic lesions suspicious for metastatic disease, lesions demonstrate increased T2 signal, weak diffusion positivity, contrast-enhancement with gradual fading on delayed images ? CTs chest, abdomen, and pelvis on 07/06/2018-no evidence of primary malignancy, numerous hyperdense and mildly heterogenous liver lesions ? Biopsy liver lesion 07/20/2020-hepatocellular adenoma 2. G2 P2, maintained on oral contraceptives for 35 years 3. Mild neutrophilia    Disposition: Tiffany Myers appears stable.  Dr. Benay Spice reviewed the liver biopsy result with Tiffany Myers and Tiffany Myers husband at today's visit, hepatocellular adenoma.  She has discontinued oral contraceptives.  Tiffany Myers case will be presented at the upcoming GI tumor conference.  Plan at present is for a repeat MRI  of the abdomen in approximately 2 months, follow-up visit a few days later.  Patient seen with Dr. Benay Spice.  Ned Card ANP/GNP-BC   07/23/2020  2:32 PM  This was a shared visit with Ned Card.  I discussed the possibility of hepatic adenomas with Tiffany Myers by telephone on 07/17/2020.  We discussed foregoing a biopsy, stopping oral contraceptives, and performing a repeat CT at a 2 to 31-month interval.  She felt more comfortable proceeding with the diagnostic biopsy.  The biopsy confirms a hepatic adenoma.  She appears to have multifocal hepatic adenomas related to oral contraceptive use.  She has discontinued the contraceptives.  I will present Tiffany Myers case at the GI tumor conference and consider making a surgical oncology referral.  We will plan for a repeat liver MRI in 2 months.  I was present for greater than 50% of today's visit.  I performed medical decision making.  Julieanne Manson, MD

## 2020-07-28 ENCOUNTER — Other Ambulatory Visit: Payer: Self-pay

## 2020-07-28 ENCOUNTER — Other Ambulatory Visit: Payer: Self-pay | Admitting: Student

## 2020-07-28 NOTE — Progress Notes (Signed)
Spoke with patient per Dr. Julieanne Manson regarding outcome of Tumor Board discussion today.  I explained to her that the recommendation is to refer her to [name] for surgical consideration.  She verbalized an understanding.  Referral has been made to [name] at [facility].

## 2020-07-28 NOTE — Progress Notes (Signed)
The proposed treatment discussed in conference is for discussion purposes only and is not a binding recommendation.  The patients have not been physically examined, or presented with their treatment options.  Therefore, final treatment plans cannot be decided.   

## 2020-08-06 ENCOUNTER — Telehealth: Payer: Self-pay | Admitting: *Deleted

## 2020-08-06 DIAGNOSIS — D134 Benign neoplasm of liver: Secondary | ICD-10-CM | POA: Diagnosis not present

## 2020-08-06 NOTE — Telephone Encounter (Signed)
Return call from [facility]--request to move MRI was per Dr. Zenia Resides.

## 2020-08-06 NOTE — Telephone Encounter (Signed)
Call from [facility] requesting the July MRI be rescheduled to mid to late August. Called back and left VM asking if this is surgeon request or patient request?

## 2020-08-25 DIAGNOSIS — Z1231 Encounter for screening mammogram for malignant neoplasm of breast: Secondary | ICD-10-CM | POA: Diagnosis not present

## 2020-08-25 DIAGNOSIS — Z6821 Body mass index (BMI) 21.0-21.9, adult: Secondary | ICD-10-CM | POA: Diagnosis not present

## 2020-08-25 DIAGNOSIS — E8941 Symptomatic postprocedural ovarian failure: Secondary | ICD-10-CM | POA: Diagnosis not present

## 2020-08-25 DIAGNOSIS — Z01419 Encounter for gynecological examination (general) (routine) without abnormal findings: Secondary | ICD-10-CM | POA: Diagnosis not present

## 2020-09-06 DIAGNOSIS — Z13820 Encounter for screening for osteoporosis: Secondary | ICD-10-CM | POA: Diagnosis not present

## 2020-09-16 ENCOUNTER — Encounter: Payer: Self-pay | Admitting: Internal Medicine

## 2020-09-16 ENCOUNTER — Ambulatory Visit: Payer: BC Managed Care – PPO | Admitting: Internal Medicine

## 2020-09-16 VITALS — BP 142/76 | HR 84 | Wt 124.0 lb

## 2020-09-16 DIAGNOSIS — D134 Benign neoplasm of liver: Secondary | ICD-10-CM | POA: Diagnosis not present

## 2020-09-16 NOTE — Progress Notes (Signed)
Tiffany Myers 55 y.o. Aug 09, 1965 [id number]  Assessment & Plan:   Encounter Diagnosis  Name Primary?   Hepatic adenomas Yes    She is appropriately being followed by Dr. Zenia Resides of hepatobiliary surgery.  Defer management of these hepatic adenomas to Dr. Zenia Resides. I am happy to be her gastroenterologist when needed.  See me as needed otherwise. We will obtain records of her colonoscopy for completeness, she seems like a reliable historian and reports that it was negative back in 2018 so she would need a repeat in 2028 as far as routine screening.   I appreciate the opportunity to care for this patient. CC: Tiffany Smoker, MD [name] Subjective:   Chief Complaint: Hepatic adenomas  HPI This is a very pleasant 55 year old white woman who had liver lesions diagnosed after alkaline phosphatase was noted to be elevated, and had an MRI that raise the possibility of malignancy and went on to have biopsy that showed she has hepatocellular adenomas.  She was on birth control pill for many years and had those stopped, she has seen [name] of hepatobiliary surgery, [place].  Due to have an MRI again this fall.  To see if adenomas are shrinking in the face of withdrawal of hormone birth control treatment.  Father did die of cirrhosis because not discussed today. Narrative & Impression  CLINICAL DATA:  Elevated liver function studies. Abnormal ultrasound.   EXAM: MRI ABDOMEN WITHOUT AND WITH CONTRAST   TECHNIQUE: Multiplanar multisequence MR imaging of the abdomen was performed both before and after the administration of intravenous contrast.   CONTRAST:  68mL MULTIHANCE GADOBENATE DIMEGLUMINE 529 MG/ML IV SOLN   COMPARISON:  Ultrasound 06/15/2020   FINDINGS: Lower chest: The lung bases are clear of an acute process. No pulmonary lesions or pleural effusion. No pericardial effusion.   Hepatobiliary: There are innumerable hepatic lesions  throughout both lobes. These lesions demonstrate heterogeneous increased T2 signal intensity and are difficult to see on the T1 weighted images. A appear to be weakly diffusion positive. All of the lesions demonstrate contrast enhancement with gradual fading on the delayed images. Findings are highly suspicious for diffuse hepatic metastatic disease.   Index lesions include:   4.9 x 2.8 cm lesion in segment 3   2.0 x 1.3 cm lesion in segment 5.   1.6 x 1.5 cm lesion near the right kidney in segment 6.   1.8 x 1.6 cm lesion in segment 6 more inferiorly.   No obvious primary lesion is identified. Breast cancer, lung cancer and colon cancer would be considerations. PET-CT or CT scan of the chest, abdomen and pelvis may be helpful to assess for a primary lesion.   No biliary dilatation. Gallbladder is unremarkable. No common bile duct dilatation.   Pancreas:  No mass, inflammation or ductal dilatation.   Spleen:  Normal size.  Small splenic cyst noted anteriorly.   Adrenals/Urinary Tract: The adrenal glands kidneys are normal. No renal lesions or hydronephrosis.   Stomach/Bowel: The stomach, duodenum, visualized small bowel and visualized colon are grossly normal.   Vascular/Lymphatic: The aorta and branch vessels are normal. The major venous structures are patent. No mesenteric or retroperitoneal mass or adenopathy.   Other:  No ascites or abdominal wall hernia.   Musculoskeletal: No significant bony findings.   IMPRESSION: 1. Innumerable hepatic lesions highly suspicious for diffuse hepatic metastatic disease. 2. No obvious primary lesion is identified. Breast cancer, lung cancer and colon cancer would be  considerations. Considerations would include biopsy, PET-CT and CT chest, abdomen/pelvis. 3. No abdominal lymphadenopathy.   Electronically Signed: By: Marijo Sanes M.D. On: 06/25/2020 16:22      MR ABDOMEN WWO CONTRAST Order #: [id number] Accession #:  [id number]   Finding Acuity Linked Recommendation Recommendation Status Finding Status  Incidental Incidental Follow-up with PCP Needs Follow-up    [place] Physician  Kalman Jewels, [place]  [phone]    Addendum:  ADDENDUM REPORT: 07/16/2020 17:11   ADDENDUM:  I reviewed this study with Dr. Julieanne Manson and have showed this  examination to some of my colleagues. Given the patient's history of  being very healthy with no known primary cancer and long history of  birth control pill usage I think it is possible that these multiple  liver lesions could be hepatic adenomas. They are difficult to  identify on the T1 weighted images and may have some slight  increased T1 signal intensity on the precontrast fat saturated  series. They do not washout quickly but do fade gradually on the  delayed post-contrast images. They are also equivocal on the  diffusion-weighted images.   I think in this situation it may be prudent to repeat an MRI of the  abdomen using Eovist contrast material in 3 months, after the  patient has been off the birth control pills.    Electronically Signed    By: Marijo Sanes M.D.    On: 07/16/2020 17:11    Signed by Kalman Jewels, MD on 07/16/2020  5:14 PM     FINAL MICROSCOPIC DIAGNOSIS:   A. LIVER, LEFT, NEEDLE CORE BIOPSY:  - Hepatocellular adenoma.  - See comment.   COMMENT:   Dr. Vic Ripper agrees    GROSS DESCRIPTION:   Received in formalin are 3 cores of red-brown soft tissue which range  from 1.2 x 0.1 cm to 1.6 x 0.1 cm, are bisected and evenly divided among  2 blocks for routine histology.  Current Meds  Medication Sig   Calcium Carb-Cholecalciferol (CALCIUM 600 + D PO) Take 2 tablets by mouth daily.   Cholecalciferol (VITAMIN D) 50 MCG (2000 UT) tablet Take 2,000 Units by mouth daily.   fluticasone (FLONASE) 50 MCG/ACT nasal spray Place 1 spray into both nostrils daily as needed for allergies or rhinitis.   ibuprofen  (ADVIL) 200 MG tablet Take 400 mg by mouth every 6 (six) hours as needed for headache or moderate pain.   Past Medical History:  Diagnosis Date   Hepatic adenomas    Past Surgical History:  Procedure Laterality Date   COLONOSCOPY  12/28/2016   LIVER BIOPSY     Social History   Social History Narrative   Patient is married she has 1 son and 1 daughter   She works part-time at a [employer]   Never Myers 1 alcoholic drink per day 0-1 caffeinated beverages a day no drug use no other tobacco   family history includes Cirrhosis in her father.   Review of Systems As per HPI all other review of systems are negative  Objective:   Physical Exam @BP  (!) 142/76   Pulse 84   Wt 124 lb (56.2 kg)   LMP 06/21/2020   Breastfeeding Unknown   BMI 22.68 kg/m @  General:  NAD Eyes:   anicteric Lungs:  clear Heart::  S1S2 no rubs, murmurs or gallops Abdomen:  soft and nontender, BS+ Ext:   no edema, cyanosis or clubbing    Data  Reviewed:   See HPI

## 2020-09-16 NOTE — Patient Instructions (Signed)
It was nice to meet you today.  I hope the adenomas shrink now that you are off birth control pills.  We will get the records of your colonoscopy for our files and once I see them will place you in our reminder system for colonoscopy 10 years from that date.  I appreciate the opportunity to care for you. Gatha Mayer, MD, [facility]

## 2020-09-17 ENCOUNTER — Encounter: Payer: Self-pay | Admitting: Internal Medicine

## 2020-09-20 ENCOUNTER — Other Ambulatory Visit: Payer: BC Managed Care – PPO

## 2020-09-22 ENCOUNTER — Inpatient Hospital Stay: Payer: BC Managed Care – PPO

## 2020-09-22 ENCOUNTER — Inpatient Hospital Stay: Payer: BC Managed Care – PPO | Admitting: Oncology

## 2020-10-17 ENCOUNTER — Ambulatory Visit
Admission: RE | Admit: 2020-10-17 | Discharge: 2020-10-17 | Disposition: A | Discharge status: Home / Self Care | Payer: BC Managed Care – PPO | Source: Ambulatory Visit | Attending: Nurse Practitioner | Admitting: Nurse Practitioner

## 2020-10-17 ENCOUNTER — Other Ambulatory Visit: Payer: Self-pay

## 2020-10-17 DIAGNOSIS — D134 Benign neoplasm of liver: Secondary | ICD-10-CM

## 2020-10-17 MED ORDER — GADOXETATE DISODIUM 0.25 MMOL/ML IV SOLN
5.0000 mL | Freq: Once | INTRAVENOUS | Status: AC | PRN
  Administered 2020-10-17: 5 mL via INTRAVENOUS

## 2020-10-20 ENCOUNTER — Other Ambulatory Visit: Payer: BC Managed Care – PPO

## 2020-11-24 DIAGNOSIS — Z23 Encounter for immunization: Secondary | ICD-10-CM | POA: Diagnosis not present

## 2020-11-24 DIAGNOSIS — Z0000 Encounter for general adult medical examination without abnormal findings: Secondary | ICD-10-CM | POA: Diagnosis not present

## 2020-11-24 DIAGNOSIS — Z13220 Encounter for screening for lipoid disorders: Secondary | ICD-10-CM | POA: Diagnosis not present

## 2020-11-24 DIAGNOSIS — R030 Elevated blood-pressure reading, without diagnosis of hypertension: Secondary | ICD-10-CM | POA: Diagnosis not present

## 2020-11-24 DIAGNOSIS — M810 Age-related osteoporosis without current pathological fracture: Secondary | ICD-10-CM | POA: Diagnosis not present

## 2020-12-07 DIAGNOSIS — H524 Presbyopia: Secondary | ICD-10-CM | POA: Diagnosis not present

## 2020-12-07 DIAGNOSIS — H2513 Age-related nuclear cataract, bilateral: Secondary | ICD-10-CM | POA: Diagnosis not present

## 2020-12-07 DIAGNOSIS — D3131 Benign neoplasm of right choroid: Secondary | ICD-10-CM | POA: Diagnosis not present

## 2020-12-07 DIAGNOSIS — H5213 Myopia, bilateral: Secondary | ICD-10-CM | POA: Diagnosis not present

## 2020-12-31 DIAGNOSIS — R748 Abnormal levels of other serum enzymes: Secondary | ICD-10-CM | POA: Diagnosis not present

## 2020-12-31 DIAGNOSIS — M810 Age-related osteoporosis without current pathological fracture: Secondary | ICD-10-CM | POA: Diagnosis not present

## 2020-12-31 DIAGNOSIS — R160 Hepatomegaly, not elsewhere classified: Secondary | ICD-10-CM | POA: Diagnosis not present

## 2021-02-09 DIAGNOSIS — L568 Other specified acute skin changes due to ultraviolet radiation: Secondary | ICD-10-CM | POA: Diagnosis not present

## 2021-02-09 DIAGNOSIS — D2272 Melanocytic nevi of left lower limb, including hip: Secondary | ICD-10-CM | POA: Diagnosis not present

## 2021-02-09 DIAGNOSIS — D485 Neoplasm of uncertain behavior of skin: Secondary | ICD-10-CM | POA: Diagnosis not present

## 2021-02-09 DIAGNOSIS — L578 Other skin changes due to chronic exposure to nonionizing radiation: Secondary | ICD-10-CM | POA: Diagnosis not present

## 2021-02-09 DIAGNOSIS — L821 Other seborrheic keratosis: Secondary | ICD-10-CM | POA: Diagnosis not present

## 2021-03-23 ENCOUNTER — Other Ambulatory Visit: Payer: Self-pay | Admitting: Surgery

## 2021-03-23 DIAGNOSIS — D135 Benign neoplasm of extrahepatic bile ducts: Secondary | ICD-10-CM

## 2021-03-23 DIAGNOSIS — K7689 Other specified diseases of liver: Secondary | ICD-10-CM

## 2021-04-05 DIAGNOSIS — D3121 Benign neoplasm of right retina: Secondary | ICD-10-CM | POA: Diagnosis not present

## 2021-04-05 DIAGNOSIS — H43823 Vitreomacular adhesion, bilateral: Secondary | ICD-10-CM | POA: Diagnosis not present

## 2021-04-05 DIAGNOSIS — H3540 Unspecified peripheral retinal degeneration: Secondary | ICD-10-CM | POA: Diagnosis not present

## 2021-04-15 ENCOUNTER — Ambulatory Visit
Admission: RE | Admit: 2021-04-15 | Discharge: 2021-04-15 | Disposition: A | Discharge status: Home / Self Care | Payer: BC Managed Care – PPO | Source: Ambulatory Visit | Attending: Surgery | Admitting: Surgery

## 2021-04-15 ENCOUNTER — Other Ambulatory Visit: Payer: Self-pay

## 2021-04-15 DIAGNOSIS — D134 Benign neoplasm of liver: Secondary | ICD-10-CM | POA: Diagnosis not present

## 2021-04-15 DIAGNOSIS — D135 Benign neoplasm of extrahepatic bile ducts: Secondary | ICD-10-CM

## 2021-04-15 DIAGNOSIS — K7689 Other specified diseases of liver: Secondary | ICD-10-CM

## 2021-04-15 MED ORDER — GADOBENATE DIMEGLUMINE 529 MG/ML IV SOLN
11.0000 mL | Freq: Once | INTRAVENOUS | Status: AC | PRN
  Administered 2021-04-15: 11 mL via INTRAVENOUS

## 2021-04-27 DIAGNOSIS — D134 Benign neoplasm of liver: Secondary | ICD-10-CM | POA: Diagnosis not present

## 2021-08-01 NOTE — Progress Notes (Unsigned)
[place] Phone: [phone] Subjective:   Tiffany Myers, am serving as a scribe for Dr. Hulan Saas.   I'm seeing this patient by the request  of:  [name], MD  CC: hip and leg pain   [id number]  Tiffany Myers is a 56 y.o. female coming in with complaint of L hip and leg pain since April. Patient does [employer]. Did a challenge at the gym which required a lot of hip abduction. Pain was constant prior to last week. Pain improved last week on its own. Lumbar spine pain is localized to L side as well. Pain radiates down L leg and sometimes feels tingling into leg.  Patient states that at this moment the patient is about 90% better.  Just wanting to know if she can get back to her activities including exercising.   Patient did have a repeat hepatic adenoma showing that the decreasing in size    Past Medical History:  Diagnosis Date   Hepatic adenomas    Past Surgical History:  Procedure Laterality Date   COLONOSCOPY  12/28/2016   LIVER BIOPSY     Social History   Socioeconomic History   Marital status: Married    Spouse name: Not on file   Number of children: Not on file   Years of education: Not on file   Highest education level: Not on file  Occupational History   Not on file  Tobacco Use   Smoking status: Never   Smokeless tobacco: Never  Vaping Use   Vaping Use: Never used  Substance and Sexual Activity   Alcohol use: Yes    Comment: social   Drug use: Never   Sexual activity: Not on file  Other Topics Concern   Not on file  Social History Narrative   Patient is married she has 1 son and 1 daughter   She works part-time at a Electronics engineer   Never smoker 1 alcoholic drink per day 0-1 caffeinated beverages a day Myers drug use Myers other tobacco   Social Determinants of Radio broadcast assistant Strain: Not on file  Food Insecurity: Not on file   Transportation Needs: Not on file  Physical Activity: Not on file  Stress: Not on file  Social Connections: Not on file   Allergies  Allergen Reactions   Doxycycline Rash   Penicillins Rash   Sulfa Antibiotics Rash   Family History  Problem Relation Age of Onset   Cirrhosis Father       Current Outpatient Medications (Respiratory):    fluticasone (FLONASE) 50 MCG/ACT nasal spray, Place 1 spray into both nostrils daily as needed for allergies or rhinitis.  Current Outpatient Medications (Analgesics):    ibuprofen (ADVIL) 200 MG tablet, Take 400 mg by mouth every 6 (six) hours as needed for headache or moderate pain.   Current Outpatient Medications (Other):    Calcium Carb-Cholecalciferol (CALCIUM 600 + D PO), Take 2 tablets by mouth daily.   Cholecalciferol (VITAMIN D) 50 MCG (2000 UT) tablet, Take 2,000 Units by mouth daily.   Reviewed prior external information including notes and imaging from  primary care provider As well as notes that were available from care everywhere and other healthcare systems.  Past medical history, social, surgical and family history all reviewed in electronic medical record.  Myers pertanent information unless stated regarding to the chief complaint.   Review of Systems:  Myers headache, visual changes, nausea, vomiting, diarrhea, constipation, dizziness, abdominal pain, skin rash, fevers, chills, night sweats, weight loss, swollen lymph nodes, body aches, joint swelling, chest pain, shortness of breath, mood changes. POSITIVE muscle aches  Objective  Last menstrual period 06/21/2020, unknown if currently breastfeeding.   General: Myers apparent distress alert and oriented x3 mood and affect normal, dressed appropriately.  HEENT: Pupils equal, extraocular movements intact  Respiratory: Patient's speak in full sentences and does not appear short of breath  Cardiovascular: Myers lower extremity edema, non tender, Myers erythema  Gait normal with good  balance and coordination.  MSK: Patient's right mid back very mild tightness noted.  More little more tightness around the sacroiliac joint.  Patient does have some very mild scoliosis noted.  Negative straight leg test.  5-5 strength of the lower extremities.   [id number]; 15 additional minutes spent for Therapeutic exercises as stated in above notes.  This included exercises focusing on stretching, strengthening, with significant focus on eccentric aspects.   Long term goals include an improvement in range of motion, strength, endurance as well as avoiding reinjury. Patient's frequency would include in 1-2 times a day, 3-5 times a week for a duration of 6-12 weeks. Low back exercises that included:  Pelvic tilt/bracing instruction to focus on control of the pelvic girdle and lower abdominal muscles  Glute strengthening exercises, focusing on proper firing of the glutes without engaging the low back muscles Proper stretching techniques for maximum relief for the hamstrings, hip flexors, low back and some rotation where tolerated  Proper technique shown and discussed handout in great detail with ATC.  All questions were discussed and answered.     Impression and Recommendations:    The above documentation has been reviewed and is accurate and complete Lyndal Pulley, DO

## 2021-08-03 ENCOUNTER — Encounter: Payer: Self-pay | Admitting: Family Medicine

## 2021-08-03 ENCOUNTER — Ambulatory Visit: Payer: BC Managed Care – PPO | Admitting: Family Medicine

## 2021-08-03 ENCOUNTER — Ambulatory Visit (INDEPENDENT_AMBULATORY_CARE_PROVIDER_SITE_OTHER): Payer: BC Managed Care – PPO

## 2021-08-03 VITALS — BP 138/90 | HR 96 | Ht 62.0 in | Wt 126.0 lb

## 2021-08-03 DIAGNOSIS — M545 Low back pain, unspecified: Secondary | ICD-10-CM

## 2021-08-03 DIAGNOSIS — M25552 Pain in left hip: Secondary | ICD-10-CM

## 2021-08-03 NOTE — Assessment & Plan Note (Addendum)
patient did have more of a muscle injury and could have been more of the quadratus lumborum or the possibility of the iliac [place].  I do believe the patient is going to be making improvement, discussed icing regimen and home exercises.  Which activities to do and which ones to avoid, increase activity slowly.  Follow-up with me again 6 to 8 weeks if needed.

## 2021-08-03 NOTE — Patient Instructions (Addendum)
Xray today Hip flexor exercises Vit D 2000IU daily Ease back into Pure Barre See me in 5-6 weeks

## 2021-09-09 NOTE — Progress Notes (Unsigned)
[place] Phone: [phone] Subjective:   Fontaine No, am serving as a scribe for Dr. Hulan Saas.   I'm seeing this patient by the request  of:  [name], MD  CC: Low back pain follow-up  [id number]  08/03/2021 patient did have more of a muscle injury and could have been more of the quadratus lumborum or the possibility of the iliac [place].  I do believe the patient is going to be making improvement, discussed icing regimen and home exercises.  Which activities to do and which ones to avoid, increase activity slowly.  Follow-up with me again 6 to 8 weeks if needed.  Update 09/12/2021 BRANDON SCARBROUGH is a 56 y.o. female coming in with complaint of L hip and lumbar spine pain. Patient states that she is trying to walk and strengthen her hip. Feels like she might be slowly improving.   Xray L hip 08/03/2021 IMPRESSION: No significant arthritis of the left hip.  Xray lumbar spine 08/03/2021 IMPRESSION: Negative.    Past Medical History:  Diagnosis Date   Hepatic adenomas    Past Surgical History:  Procedure Laterality Date   COLONOSCOPY  12/28/2016   LIVER BIOPSY     Social History   Socioeconomic History   Marital status: Married    Spouse name: Not on file   Number of children: Not on file   Years of education: Not on file   Highest education level: Not on file  Occupational History   Not on file  Tobacco Use   Smoking status: Never   Smokeless tobacco: Never  Vaping Use   Vaping Use: Never used  Substance and Sexual Activity   Alcohol use: Yes    Comment: social   Drug use: Never   Sexual activity: Not on file  Other Topics Concern   Not on file  Social History Narrative   Patient is married she has 1 son and 1 daughter   She works part-time at a [employer]   Never smoker 1 alcoholic drink per day 0-1 caffeinated beverages a day no drug use no other  tobacco   Social Determinants of Radio broadcast assistant Strain: Not on file  Food Insecurity: Not on file  Transportation Needs: Not on file  Physical Activity: Not on file  Stress: Not on file  Social Connections: Not on file   Allergies  Allergen Reactions   Doxycycline Rash   Penicillins Rash   Sulfa Antibiotics Rash   Family History  Problem Relation Age of Onset   Cirrhosis Father       Current Outpatient Medications (Respiratory):    fluticasone (FLONASE) 50 MCG/ACT nasal spray, Place 1 spray into both nostrils daily as needed for allergies or rhinitis.  Current Outpatient Medications (Analgesics):    ibuprofen (ADVIL) 200 MG tablet, Take 400 mg by mouth every 6 (six) hours as needed for headache or moderate pain.   Current Outpatient Medications (Other):    Calcium Carb-Cholecalciferol (CALCIUM 600 + D PO), Take 2 tablets by mouth daily.   Cholecalciferol (VITAMIN D) 50 MCG (2000 UT) tablet, Take 2,000 Units by mouth daily.   gabapentin (NEURONTIN) 100 MG capsule, Take 2 capsules (200 mg total) by mouth at bedtime.   Review of Systems:  No headache, visual changes, nausea, vomiting, diarrhea, constipation, dizziness, abdominal pain, skin rash, fevers, chills, night sweats, weight loss, swollen lymph nodes, body aches, joint  swelling, chest pain, shortness of breath, mood changes. POSITIVE muscle aches  Objective  Blood pressure 120/88, pulse 73, height '5\' 2"'$  (1.575 m), weight 126 lb (57.2 kg), last menstrual period 06/16/2020, SpO2 99 %, unknown if currently breastfeeding.   General: No apparent distress alert and oriented x3 mood and affect normal, dressed appropriately.  HEENT: Pupils equal, extraocular movements intact  Respiratory: Patient's speak in full sentences and does not appear short of breath  Cardiovascular: No lower extremity edema, non tender, no erythema  Low back exam still has some loss of lordosis.  Still some tenderness to palpation in  the paraspinal musculature.  Patient does have some improvement in range of motion of the hip.  Still some tenderness noted on the anterior lateral aspect of the hip.  5-5 strength noted.    Impression and Recommendations:     The above documentation has been reviewed and is accurate and complete Lyndal Pulley, DO

## 2021-09-12 ENCOUNTER — Ambulatory Visit: Payer: BC Managed Care – PPO | Admitting: Family Medicine

## 2021-09-12 DIAGNOSIS — M545 Low back pain, unspecified: Secondary | ICD-10-CM | POA: Diagnosis not present

## 2021-09-12 MED ORDER — GABAPENTIN 100 MG PO CAPS: 200.0000 mg | ORAL_CAPSULE | Freq: Every day | ORAL | 0 refills | Status: DC

## 2021-09-12 NOTE — Patient Instructions (Signed)
Great to see you Gabapentin '200mg'$  at night Keep increasing activity See me in 6-8 weeks

## 2021-09-12 NOTE — Assessment & Plan Note (Signed)
Patient is doing better overall.  Still has the tightness noted of the left hip as well as the back.  We have gotten the x-rays that showed that patient had no significant arthritic changes.  Seems to be more muscular in nature.  Discussed with patient about gabapentin patient would be willing to try it at this time prescribed today to see if this will help to know if any other radicular symptoms are secondary to nerve elevation.  Patient has made a 75% improvement already otherwise and we will continue the other conservative therapy and follow-up with [place] again in 6 to 8 weeks otherwise.

## 2021-09-29 DIAGNOSIS — Z1231 Encounter for screening mammogram for malignant neoplasm of breast: Secondary | ICD-10-CM | POA: Diagnosis not present

## 2021-10-21 NOTE — Progress Notes (Unsigned)
[place] [address] [place] Phone: [phone] Subjective:   IVilma Myers, am serving as a scribe for Dr. Hulan Saas.  I'm seeing this patient by the request  of:  [name], MD  CC: Follow-up  [id number]  09/12/2021 Patient is doing better overall.  Still has the tightness noted of the left hip as well as the back.  We have gotten the x-rays that showed that patient had no significant arthritic changes.  Seems to be more muscular in nature.  Discussed with patient about gabapentin patient would be willing to try it at this time prescribed today to see if this will help to know if any other radicular symptoms are secondary to nerve elevation.  Patient has made a 75% improvement already otherwise and we will continue the other conservative therapy and follow-up with [place] again in 6 to 8 weeks otherwise.  Update 10/24/2021 Tiffany Myers is a 56 y.o. female coming in with complaint of L sided LBP.  At last exam was given gabapentin.  Patient states doing okay. Has been taking gabpentin. Doesn't see huge difference. Does feel knots on the bottom of her left  foot. No tsure if that has anything to do with her pain. No new issues.   Previous x-rays of the lumbar spine and hip were independently visualized by me today showing no sign of arthritis  Past medical history significant for hepatic adenomas.    Past Medical History:  Diagnosis Date   Hepatic adenomas    Past Surgical History:  Procedure Laterality Date   COLONOSCOPY  12/28/2016   LIVER BIOPSY     Social History   Socioeconomic History   Marital status: Married    Spouse name: Not on file   Number of children: Not on file   Years of education: Not on file   Highest education level: Not on file  Occupational History   Not on file  Tobacco Use   Smoking status: Never   Smokeless tobacco: Never  Vaping Use   Vaping Use: Never used  Substance and  Sexual Activity   Alcohol use: Yes    Comment: social   Drug use: Never   Sexual activity: Not on file  Other Topics Concern   Not on file  Social History Narrative   Patient is married she has 1 son and 1 daughter   She works part-time at a [employer] engineer   Never smoker 1 alcoholic drink per day 0-1 caffeinated beverages a day no drug use no other tobacco   Social Determinants of Radio broadcast assistant Strain: Not on file  Food Insecurity: Not on file  Transportation Needs: Not on file  Physical Activity: Not on file  Stress: Not on file  Social Connections: Not on file   Allergies  Allergen Reactions   Doxycycline Rash   Penicillins Rash   Sulfa Antibiotics Rash   Family History  Problem Relation Age of Onset   Cirrhosis Father       Current Outpatient Medications (Respiratory):    fluticasone (FLONASE) 50 MCG/ACT nasal spray, Place 1 spray into both nostrils daily as needed for allergies or rhinitis.  Current Outpatient Medications (Analgesics):    ibuprofen (ADVIL) 200 MG tablet, Take 400 mg by mouth every 6 (six) hours as needed for headache or moderate pain.   Current Outpatient Medications (Other):    Calcium Carb-Cholecalciferol (CALCIUM 600 + D PO), Take 2  tablets by mouth daily.   Cholecalciferol (VITAMIN D) 50 MCG (2000 UT) tablet, Take 2,000 Units by mouth daily.   gabapentin (NEURONTIN) 100 MG capsule, Take 2 capsules (200 mg total) by mouth at bedtime.   Reviewed prior external information including notes and imaging from  primary care provider As well as notes that were available from care everywhere and other healthcare systems.  Past medical history, social, surgical and family history all reviewed in electronic medical record.  No pertanent information unless stated regarding to the chief complaint.   Review of Systems:  No headache, visual changes, nausea, vomiting, diarrhea, constipation, dizziness, abdominal pain, skin  rash, fevers, chills, night sweats, weight loss, swollen lymph nodes, body aches, joint swelling, chest pain, shortness of breath, mood changes. POSITIVE muscle aches  Objective  Last menstrual period 06/16/2020, unknown if currently breastfeeding.   General: No apparent distress alert and oriented x3 mood and affect normal, dressed appropriately.  HEENT: Pupils equal, extraocular movements intact  Respiratory: Patient's speak in full sentences and does not appear short of breath  Cardiovascular: No lower extremity edema, non tender, no erythema  Low back exam shows patient does have some mild loss of lordosis.  Still some mild tenderness in the left side.  Patient does have some tightness and some mild tenderness over the paraspinal musculature of the sacroiliac joint.  Foot exam shows overpronation of the hindfoot noted.  Patient does have a fibroma noted on the inferior aspect of the foot.  Patient does have breakdown of the transverse arch noted as well.  [id number]; 15 additional minutes spent for Therapeutic exercises as stated in above notes.  This included exercises focusing on stretching, strengthening, with significant focus on eccentric aspects.   Long term goals include an improvement in range of motion, strength, endurance as well as avoiding reinjury. Patient's frequency would include in 1-2 times a day, 3-5 times a week for a duration of 6-12 weeks. Exercises for the foot include:  Stretches to help lengthen the lower leg and plantar fascia areas Theraband exercises for the lower leg and ankle to help strengthen the surrounding area- dorsiflexion, plantarflexion, inversion, eversion Massage rolling on the plantar surface of the foot with a frozen bottle, tennis ball or golf ball Towel or marble pick-ups to strengthen the plantar surface of the foot Weight bearing exercises to increase balance and overall stability   Proper technique shown and discussed handout in great detail with ATC.   All questions were discussed and answered.      Impression and Recommendations:    The above documentation has been reviewed and is accurate and complete Lyndal Pulley, DO

## 2021-10-24 ENCOUNTER — Ambulatory Visit: Payer: BC Managed Care – PPO | Admitting: Family Medicine

## 2021-10-24 VITALS — BP 126/86 | HR 85 | Ht 62.0 in | Wt 127.0 lb

## 2021-10-24 DIAGNOSIS — M545 Low back pain, unspecified: Secondary | ICD-10-CM | POA: Diagnosis not present

## 2021-10-24 DIAGNOSIS — M216X2 Other acquired deformities of left foot: Secondary | ICD-10-CM

## 2021-10-24 NOTE — Assessment & Plan Note (Signed)
Patient does have overpronation noted of the hindfoot patient also has breakdown of the transverse arch.  Discussed over-the-counter orthotics, given home exercises today.  We will follow-up again in 6 to 8 weeks.  Could need a possible ultrasound for the fibroma on the bottom of the foot

## 2021-10-24 NOTE — Assessment & Plan Note (Signed)
Patient has made mild strides.  Has not been doing the exercises as regularly because of discomfort chronic pain still we will increase gabapentin to 300 mg and see how patient responds.  Ibuprofen for breakthrough.  Follow-up again in 6 to 8 weeks.  Could maybe be a candidate for possible osteopathic manipulation.

## 2021-10-24 NOTE — Patient Instructions (Addendum)
[facility] [employer] Do prescribed exercises at least 3x a week Oofos recovery sandals Gabapentin increase to '300mg'$  at night

## 2021-11-10 DIAGNOSIS — Z6823 Body mass index (BMI) 23.0-23.9, adult: Secondary | ICD-10-CM | POA: Diagnosis not present

## 2021-11-10 DIAGNOSIS — Z01419 Encounter for gynecological examination (general) (routine) without abnormal findings: Secondary | ICD-10-CM | POA: Diagnosis not present

## 2021-12-06 DIAGNOSIS — D3131 Benign neoplasm of right choroid: Secondary | ICD-10-CM | POA: Diagnosis not present

## 2021-12-06 DIAGNOSIS — H2513 Age-related nuclear cataract, bilateral: Secondary | ICD-10-CM | POA: Diagnosis not present

## 2021-12-13 DIAGNOSIS — Z23 Encounter for immunization: Secondary | ICD-10-CM | POA: Diagnosis not present

## 2021-12-13 DIAGNOSIS — Z0000 Encounter for general adult medical examination without abnormal findings: Secondary | ICD-10-CM | POA: Diagnosis not present

## 2021-12-13 DIAGNOSIS — E7800 Pure hypercholesterolemia, unspecified: Secondary | ICD-10-CM | POA: Diagnosis not present

## 2021-12-13 DIAGNOSIS — Z79899 Other long term (current) drug therapy: Secondary | ICD-10-CM | POA: Diagnosis not present

## 2021-12-21 NOTE — Progress Notes (Unsigned)
[place] Phone: [phone] Subjective:   Fontaine No, am serving as a scribe for Dr. Hulan Saas.  I'm seeing this patient by the request  of:  [name], MD  CC: Low back pain follow-up, right foot pain  [id number]  10/24/2021 Patient does have overpronation noted of the hindfoot patient also has breakdown of the transverse arch.  Discussed over-the-counter orthotics, given home exercises today.  We will follow-up again in 6 to 8 weeks.  Could need a possible ultrasound for the fibroma on the bottom of the foot   Patient has made mild strides.  Has not been doing the exercises as regularly because of discomfort chronic pain still we will increase gabapentin to 300 mg and see how patient responds.  Ibuprofen for breakthrough.  Follow-up again in 6 to 8 weeks.  Could maybe be a candidate for possible osteopathic manipulation.  Update 12/22/2021 ARWILDA GEORGIA is a 56 y.o. female coming in with complaint of R foot and back pain. Patient states that her back pain is unchanged. No pain if she does not do anything.   Foot pain has improved. Working on exercises. Using gabapentin prn. Wants to know if there is another way to deal with nerve pain instead of pharmacotherapy.        Past Medical History:  Diagnosis Date   Hepatic adenomas    Past Surgical History:  Procedure Laterality Date   COLONOSCOPY  12/28/2016   LIVER BIOPSY     Social History   Socioeconomic History   Marital status: Married    Spouse name: Not on file   Number of children: Not on file   Years of education: Not on file   Highest education level: Not on file  Occupational History   Not on file  Tobacco Use   Smoking status: Never   Smokeless tobacco: Never  Vaping Use   Vaping Use: Never used  Substance and Sexual Activity   Alcohol use: Yes    Comment: social   Drug use: Never   Sexual activity: Not on  file  Other Topics Concern   Not on file  Social History Narrative   Patient is married she has 1 son and 1 daughter   She works part-time at a Electronics engineer   Never smoker 1 alcoholic drink per day 0-1 caffeinated beverages a day no drug use no other tobacco   Social Determinants of Radio broadcast assistant Strain: Not on file  Food Insecurity: Not on file  Transportation Needs: Not on file  Physical Activity: Not on file  Stress: Not on file  Social Connections: Not on file   Allergies  Allergen Reactions   Doxycycline Rash   Penicillins Rash   Sulfa Antibiotics Rash   Family History  Problem Relation Age of Onset   Cirrhosis Father       Current Outpatient Medications (Respiratory):    fluticasone (FLONASE) 50 MCG/ACT nasal spray, Place 1 spray into both nostrils daily as needed for allergies or rhinitis.  Current Outpatient Medications (Analgesics):    ibuprofen (ADVIL) 200 MG tablet, Take 400 mg by mouth every 6 (six) hours as needed for headache or moderate pain.   Current Outpatient Medications (Other):    Calcium Carb-Cholecalciferol (CALCIUM 600 + D PO), Take 2 tablets by mouth daily.   Cholecalciferol (VITAMIN D) 50 MCG (2000 UT) tablet, Take 2,000 Units by mouth daily.  gabapentin (NEURONTIN) 100 MG capsule, Take 2 capsules (200 mg total) by mouth at bedtime.   Reviewed prior external information including notes and imaging from  primary care provider As well as notes that were available from care everywhere and other healthcare systems.  Past medical history, social, surgical and family history all reviewed in electronic medical record.  No pertanent information unless stated regarding to the chief complaint.   Review of Systems:  No headache, visual changes, nausea, vomiting, diarrhea, constipation, dizziness, abdominal pain, skin rash, fevers, chills, night sweats, weight loss, swollen lymph nodes, body aches, joint swelling, chest  pain, shortness of breath, mood changes. POSITIVE muscle aches  Objective  Blood pressure 108/82, pulse 64, height '5\' 2"'$  (1.575 m), weight 124 lb (56.2 kg), last menstrual period 06/16/2020, SpO2 99 %, unknown if currently breastfeeding.   General: No apparent distress alert and oriented x3 mood and affect normal, dressed appropriately.  HEENT: Pupils equal, extraocular movements intact  Respiratory: Patient's speak in full sentences and does not appear short of breath  Cardiovascular: No lower extremity edema, non tender, no erythema  Tightness noted in the left SI joint.  Tight left hip flexor   Osteopathic findings C2 flexed rotated and side bent right C4 flexed rotated and side bent left C6 flexed rotated and side bent left T8 extended rotated and side bent left L2 flexed rotated and side bent right Sacrum right on right  Foot exam does show the patient does have some mild loss of lordosis.  Overpronation of the hindfoot noted.  Patient is minorly tender on the plantar aspect of the foot that is a little different than previous exam.  Limited muscular skeletal ultrasound was performed and interpreted by Hulan Saas, M  Limited ultrasound shows the patient does have a very small plantar heel spur noted.  Patient does also have what appears to be a varicose vein in the area just 2 cm distal to the insertion of the plantar fascia on the calcaneal area.    Impression and Recommendations:     The above documentation has been reviewed and is accurate and complete Lyndal Pulley, DO

## 2021-12-22 ENCOUNTER — Ambulatory Visit: Payer: Self-pay

## 2021-12-22 ENCOUNTER — Ambulatory Visit: Payer: BC Managed Care – PPO | Admitting: Family Medicine

## 2021-12-22 ENCOUNTER — Encounter: Payer: Self-pay | Admitting: Family Medicine

## 2021-12-22 VITALS — BP 108/82 | HR 64 | Ht 62.0 in | Wt 124.0 lb

## 2021-12-22 DIAGNOSIS — M9904 Segmental and somatic dysfunction of sacral region: Secondary | ICD-10-CM

## 2021-12-22 DIAGNOSIS — M79671 Pain in right foot: Secondary | ICD-10-CM | POA: Diagnosis not present

## 2021-12-22 DIAGNOSIS — M545 Low back pain, unspecified: Secondary | ICD-10-CM

## 2021-12-22 DIAGNOSIS — M216X2 Other acquired deformities of left foot: Secondary | ICD-10-CM | POA: Diagnosis not present

## 2021-12-22 DIAGNOSIS — M9903 Segmental and somatic dysfunction of lumbar region: Secondary | ICD-10-CM | POA: Diagnosis not present

## 2021-12-22 DIAGNOSIS — M9902 Segmental and somatic dysfunction of thoracic region: Secondary | ICD-10-CM

## 2021-12-22 NOTE — Assessment & Plan Note (Signed)
Patient is on the pronation deformity of the ankle.  Did do ultrasound and found to have a varicose vein noted at this time.  Follow-up with me again after compression and see how she responds wearing good shoes.  See her again in 6 to 8 weeks

## 2021-12-22 NOTE — Assessment & Plan Note (Signed)
Low back does have some loss of lordosis.  Patient does have some tightness noted on the left side.  We did discuss different treatment options and patient has elected to try osteopathic manipulation.  Discussed which activities to do and which ones to avoid.  Patient will come back again in 6 to 8 weeks.  Can continue to use gabapentin 200 mg at night as needed

## 2021-12-22 NOTE — Patient Instructions (Signed)
Good to see you Buy socks a size to small See me again in 6-8 weeks

## 2021-12-22 NOTE — Assessment & Plan Note (Signed)
   Decision today to treat with OMT was based on Physical Exam  After verbal consent patient was treated with HVLA, ME, FPR techniques in cervical, thoracic,  lumbar and sacral areas, all areas are chronic   Patient tolerated the procedure well with improvement in symptoms  Patient given exercises, stretches and lifestyle modifications  See medications in patient instructions if given  Patient will follow up in 4-8 weeks 

## 2022-01-03 DIAGNOSIS — R160 Hepatomegaly, not elsewhere classified: Secondary | ICD-10-CM | POA: Diagnosis not present

## 2022-01-03 DIAGNOSIS — R748 Abnormal levels of other serum enzymes: Secondary | ICD-10-CM | POA: Diagnosis not present

## 2022-01-03 DIAGNOSIS — M810 Age-related osteoporosis without current pathological fracture: Secondary | ICD-10-CM | POA: Diagnosis not present

## 2022-02-02 NOTE — Progress Notes (Signed)
[facility] [place] Phone: [phone] Subjective:   Tiffany Myers, am serving as a scribe for Dr. Hulan Saas.  I'm seeing this patient by the request  of:  [name], MD  CC: neck and back pain follow up   [id number]  Tiffany Myers is a 56 y.o. female coming in with complaint of back and neck pain. OMT 12/12/2021. Also f/u for L foot pain.   Patient states that the foot pain has been doing okay the pain comes and goes and depends on the type of shoes she is wearing or how long she is on her feet.  Manipulation has been helpful for her.           Reviewed prior external information including notes and imaging from previsou exam, outside providers and external EMR if available.   As well as notes that were available from care everywhere and other healthcare systems.  Past medical history, social, surgical and family history all reviewed in electronic medical record.  No pertanent information unless stated regarding to the chief complaint.   Past Medical History:  Diagnosis Date   Hepatic adenomas     Allergies  Allergen Reactions   Doxycycline Rash   Penicillins Rash   Sulfa Antibiotics Rash     Review of Systems:  No headache, visual changes, nausea, vomiting, diarrhea, constipation, dizziness, abdominal pain, skin rash, fevers, chills, night sweats, weight loss, swollen lymph nodes, body aches, joint swelling, chest pain, shortness of breath, mood changes. POSITIVE muscle aches  Objective  Blood pressure 118/84, pulse 79, height '5\' 2"'$  (1.575 m), weight 126 lb (57.2 kg), last menstrual period 06/16/2020, SpO2 99 %, unknown if currently breastfeeding.   General: No apparent distress alert and oriented x3 mood and affect normal, dressed appropriately.  HEENT: Pupils equal, extraocular movements intact  Respiratory: Patient's speak in full sentences and does not appear short of breath   Cardiovascular: No lower extremity edema, non tender, no erythema  Patient does have relatively good range of motion of the shoulder noted.  Patient has some tightness noted in the neck and more in the cervical thoracic area.  Patient has mild limitation in sidebending.  More tightness noted in the sacroiliac joint right greater than left.  Osteopathic findings  C6 flexed rotated and side bent left T3 extended rotated and side bent right inhaled rib T8 extended rotated and side bent left L1 flexed rotated and side bent right Sacrum right on right       Assessment and Plan:  Low back pain Patient does have some arthritic changes noted.  Seems to be facet arthropathy.  Discussed icing regimen and home exercises.  Discussed withActivities to do and which ones to avoid.  Continue to work on core strengthening.  Follow-up with me again in 6 to 8 weeks If worsening symptoms with patient having the abnormal findings on the MRI for her liver I would consider the possibility of MRI of the back as well.  Nonallopathic problems  Decision today to treat with OMT was based on Physical Exam  After verbal consent patient was treated with HVLA, ME, FPR techniques in cervical, rib, thoracic, lumbar, and sacral  areas  Patient tolerated the procedure well with improvement in symptoms  Patient given exercises, stretches and lifestyle modifications  See medications in patient instructions if given  Patient will follow up in 4-8 weeks    The above documentation has been reviewed  and is accurate and complete Tiffany Pulley, DO          Note: This dictation was prepared with Dragon dictation along with smaller phrase technology. Any transcriptional errors that result from this process are unintentional.

## 2022-02-08 ENCOUNTER — Ambulatory Visit: Payer: BC Managed Care – PPO | Admitting: Family Medicine

## 2022-02-08 VITALS — BP 118/84 | HR 79 | Ht 62.0 in | Wt 126.0 lb

## 2022-02-08 DIAGNOSIS — M9904 Segmental and somatic dysfunction of sacral region: Secondary | ICD-10-CM | POA: Diagnosis not present

## 2022-02-08 DIAGNOSIS — M545 Low back pain, unspecified: Secondary | ICD-10-CM

## 2022-02-08 DIAGNOSIS — M9908 Segmental and somatic dysfunction of rib cage: Secondary | ICD-10-CM | POA: Diagnosis not present

## 2022-02-08 DIAGNOSIS — M9902 Segmental and somatic dysfunction of thoracic region: Secondary | ICD-10-CM

## 2022-02-08 DIAGNOSIS — M9901 Segmental and somatic dysfunction of cervical region: Secondary | ICD-10-CM

## 2022-02-08 DIAGNOSIS — M79671 Pain in right foot: Secondary | ICD-10-CM

## 2022-02-08 DIAGNOSIS — M9903 Segmental and somatic dysfunction of lumbar region: Secondary | ICD-10-CM

## 2022-02-08 NOTE — Assessment & Plan Note (Signed)
Patient does have some arthritic changes noted.  Seems to be facet arthropathy.  Discussed icing regimen and home exercises.  Discussed withActivities to do and which ones to avoid.  Continue to work on core strengthening.  Follow-up with me again in 6 to 8 weeks

## 2022-02-08 NOTE — Patient Instructions (Signed)
Always good to see you  Keep an MRI in our mind Continue to stay active and wear the good shoes See me again in 6-7 weeks

## 2022-02-28 DIAGNOSIS — D2272 Melanocytic nevi of left lower limb, including hip: Secondary | ICD-10-CM | POA: Diagnosis not present

## 2022-02-28 DIAGNOSIS — L578 Other skin changes due to chronic exposure to nonionizing radiation: Secondary | ICD-10-CM | POA: Diagnosis not present

## 2022-02-28 DIAGNOSIS — L821 Other seborrheic keratosis: Secondary | ICD-10-CM | POA: Diagnosis not present

## 2022-03-16 NOTE — Progress Notes (Unsigned)
Tiffany Myers [address] [place] Phone: [phone] Subjective:   Tiffany Myers, am serving as a scribe for Dr. Hulan Saas.  I'm seeing this patient by the request  of:  [name], MD  CC: Back and neck pain follow-up  [id number]  Tiffany Myers is a 57 y.o. female coming in with complaint of back and neck pain. OMT 02/08/2022. Also f/u for R foot pain. Patient states doing about the same. Numbness left side thoracic region of back. Wants to talk MRI, is getting one of the liver.  Pain is stopping her from daily activities at this point.  Patient describes it as a dull, throbbing aching sensation.  Medications patient has been prescribed: None          Reviewed prior external information including notes and imaging from previsou exam, outside providers and external EMR if available.   As well as notes that were available from care everywhere and other healthcare systems.  Past medical history, social, surgical and family history all reviewed in electronic medical record.  No pertanent information unless stated regarding to the chief complaint.   Past Medical History:  Diagnosis Date   Hepatic adenomas     Allergies  Allergen Reactions   Doxycycline Rash   Penicillins Rash   Sulfa Antibiotics Rash     Review of Systems:  No headache, visual changes, nausea, vomiting, diarrhea, constipation, dizziness, abdominal pain, skin rash, fevers, chills, night sweats, weight loss, swollen lymph nodes, body aches, joint swelling, chest pain, shortness of breath, mood changes. POSITIVE muscle aches  Objective  Blood pressure 122/86, pulse 90, height '5\' 2"'$  (1.575 m), weight 129 lb (58.5 kg), last menstrual period 06/16/2020, SpO2 98 %, unknown if currently breastfeeding.   General: No apparent distress alert and oriented x3 mood and affect normal, dressed appropriately.  HEENT: Pupils equal, extraocular movements  intact  Respiratory: Patient's speak in full sentences and does not appear short of breath  Cardiovascular: No lower extremity edema, non tender, no erythema  Back exam does have some loss of lordosis.  Some tenderness to palpation in the paraspinal musculature.  Patient does have tightness with straight leg test.  Patient does have mild weakness on the left side with some mild radicular symptoms.  Tightness with FABER test.  Patient is very uncomfortable even at rest.  Osteopathic findings  C2 flexed rotated and side bent right C6 flexed rotated and side bent left T3 extended rotated and side bent right inhaled rib T9 extended rotated and side bent left L2 flexed rotated and side bent right Sacrum left on left       Assessment and Plan:  Low back pain Low back pain with worsening symptoms.  At this point with patient also having the of adenomatous I do think that further evaluation with advanced imaging with patient failing medications, physical therapy, home exercises without any significant improvement would be warranted.  Patient is scheduled to have an MRI of the liver in the near future but I do think an MRI of the lumbar would be more helpful for this problem.  Patient will see if she can schedule it sooner and see if patient could be a candidate for possible injections if necessary.  Total time with patient today 32 minutes this does not include any procedural time    Nonallopathic problems  Decision today to treat with OMT was based on Physical Exam  After verbal consent patient was  treated with , ME, FPR techniques in cervical, rib, thoracic, lumbar, and sacral  areas avoided any HVLA though secondary to patient having worsening pain with the left-sided radicular symptoms today.  Patient tolerated the procedure well with improvement in symptoms  Patient given exercises, stretches and lifestyle modifications  See medications in patient instructions if given  Patient will  follow up in 4-8 weeks     The above documentation has been reviewed and is accurate and complete Tiffany Pulley, DO         Note: This dictation was prepared with Dragon dictation along with smaller phrase technology. Any transcriptional errors that result from this process are unintentional.

## 2022-03-22 ENCOUNTER — Ambulatory Visit: Payer: BC Managed Care – PPO | Admitting: Family Medicine

## 2022-03-22 VITALS — BP 122/86 | HR 90 | Ht 62.0 in | Wt 129.0 lb

## 2022-03-22 DIAGNOSIS — M545 Low back pain, unspecified: Secondary | ICD-10-CM

## 2022-03-22 DIAGNOSIS — M9901 Segmental and somatic dysfunction of cervical region: Secondary | ICD-10-CM

## 2022-03-22 DIAGNOSIS — M9903 Segmental and somatic dysfunction of lumbar region: Secondary | ICD-10-CM

## 2022-03-22 DIAGNOSIS — M9904 Segmental and somatic dysfunction of sacral region: Secondary | ICD-10-CM | POA: Diagnosis not present

## 2022-03-22 DIAGNOSIS — M9902 Segmental and somatic dysfunction of thoracic region: Secondary | ICD-10-CM

## 2022-03-22 DIAGNOSIS — M9908 Segmental and somatic dysfunction of rib cage: Secondary | ICD-10-CM

## 2022-03-22 NOTE — Assessment & Plan Note (Signed)
Low back pain with worsening symptoms.  At this point with patient also having the of adenomatous I do think that further evaluation with advanced imaging with patient failing medications, physical therapy, home exercises without any significant improvement would be warranted.  Patient is scheduled to have an MRI of the liver in the near future but I do think an MRI of the lumbar would be more helpful for this problem.  Patient will see if she can schedule it sooner and see if patient could be a candidate for possible injections if necessary.  Total time with patient today 32 minutes this does not include any procedural time

## 2022-03-22 NOTE — Patient Instructions (Signed)
MRI  [phone] See me in 6-8 weeks

## 2022-03-23 ENCOUNTER — Other Ambulatory Visit: Payer: Self-pay | Admitting: Surgery

## 2022-03-23 DIAGNOSIS — D134 Benign neoplasm of liver: Secondary | ICD-10-CM

## 2022-04-11 ENCOUNTER — Ambulatory Visit
Admission: RE | Admit: 2022-04-11 | Discharge: 2022-04-11 | Disposition: A | Discharge status: Home / Self Care | Payer: BC Managed Care – PPO | Source: Ambulatory Visit | Attending: Surgery | Admitting: Surgery

## 2022-04-11 ENCOUNTER — Ambulatory Visit
Admission: RE | Admit: 2022-04-11 | Discharge: 2022-04-11 | Disposition: A | Discharge status: Home / Self Care | Payer: BC Managed Care – PPO | Source: Ambulatory Visit | Attending: Family Medicine | Admitting: Family Medicine

## 2022-04-11 DIAGNOSIS — M545 Low back pain, unspecified: Secondary | ICD-10-CM

## 2022-04-11 DIAGNOSIS — M5127 Other intervertebral disc displacement, lumbosacral region: Secondary | ICD-10-CM | POA: Diagnosis not present

## 2022-04-11 DIAGNOSIS — D134 Benign neoplasm of liver: Secondary | ICD-10-CM

## 2022-04-11 MED ORDER — GADOPICLENOL 0.5 MMOL/ML IV SOLN
5.0000 mL | Freq: Once | INTRAVENOUS | Status: AC | PRN
  Administered 2022-04-11: 5 mL via INTRAVENOUS

## 2022-04-14 DIAGNOSIS — D134 Benign neoplasm of liver: Secondary | ICD-10-CM | POA: Diagnosis not present

## 2022-05-09 DIAGNOSIS — H35361 Drusen (degenerative) of macula, right eye: Secondary | ICD-10-CM | POA: Diagnosis not present

## 2022-05-09 DIAGNOSIS — D3131 Benign neoplasm of right choroid: Secondary | ICD-10-CM | POA: Diagnosis not present

## 2022-05-09 DIAGNOSIS — H43823 Vitreomacular adhesion, bilateral: Secondary | ICD-10-CM | POA: Diagnosis not present

## 2022-05-09 DIAGNOSIS — H2513 Age-related nuclear cataract, bilateral: Secondary | ICD-10-CM | POA: Diagnosis not present

## 2022-05-16 NOTE — Progress Notes (Signed)
[facility] [address] [place] Phone: [phone] Subjective:   Tiffany Myers, am serving as a scribe for Dr. Hulan Saas.  I'm seeing this patient by the request  of:  [name], MD  CC: Back and neck pain follow-up  [id number]  Tiffany Myers is a 57 y.o. female coming in with complaint of back and neck pain. OMT 03/22/2022. Patient states doing well. Same per usual. Here for manipulation. Would like to go over MRI results. No new concerns.   Medications patient has been prescribed: None  Taking:         Reviewed prior external information including notes and imaging from previsou exam, outside providers and external EMR if available.   As well as notes that were available from care everywhere and other healthcare systems.  Past medical history, social, surgical and family history all reviewed in electronic medical record.  No pertanent information unless stated regarding to the chief complaint.   Past Medical History:  Diagnosis Date   Hepatic adenomas     Allergies  Allergen Reactions   Doxycycline Rash   Penicillins Rash   Sulfa Antibiotics Rash     Review of Systems:  No headache, visual changes, nausea, vomiting, diarrhea, constipation, dizziness, abdominal pain, skin rash, fevers, chills, night sweats, weight loss, swollen lymph nodes, body aches, joint swelling, chest pain, shortness of breath, mood changes. POSITIVE muscle aches  Objective  Blood pressure 122/84, pulse 79, height 5\' 2"  (1.575 m), weight 129 lb (58.5 kg), last menstrual period 06/16/2020, SpO2 97 %, unknown if currently breastfeeding.   General: No apparent distress alert and oriented x3 mood and affect normal, dressed appropriately.  HEENT: Pupils equal, extraocular movements intact  Respiratory: Patient's speak in full sentences and does not appear short of breath  Cardiovascular: No lower extremity edema, non tender,  no erythema  Back exam does have some loss of lordosis noted.  Patient does have tenderness over the sacroiliac joints bilaterally.  Osteopathic findings  C7 flexed rotated and side bent left T3 extended rotated and side bent right inhaled rib T7 extended rotated and side bent left L2 flexed rotated and side bent right L4 flexed rotated and side bent right Sacrum right on right       Assessment and Plan:  Low back pain Low back does have some loss of lordosis noted we did have the MRI showing the patient does have an L5-S1 protruding disc that could be potentially causing nerve impingement.  Patient would like to continue with the conservative treatment at the moment including osteopathic manipulation when available.  Discussed with patient about core strengthening still at this time.  Discussed which activities to do and which ones to avoid.  Increase activity slowly.  Follow-up again in 6 to 8 weeks Total time reviewing outside records and MRI of the liver as well as the lumbar spine and discussing with patient not including the osteopathic manipulation 31 minutes   Nonallopathic problems  Decision today to treat with OMT was based on Physical Exam  After verbal consent patient was treated with HVLA, ME, FPR techniques in cervical, rib, thoracic, lumbar, and sacral  areas  Patient tolerated the procedure well with improvement in symptoms  Patient given exercises, stretches and lifestyle modifications  See medications in patient instructions if given  Patient will follow up in 4-8 weeks    The above documentation has been reviewed and is accurate and complete Tiffany Myers  Tiffany Julian, DO          Note: This dictation was prepared with Dragon dictation along with smaller phrase technology. Any transcriptional errors that result from this process are unintentional.

## 2022-05-17 ENCOUNTER — Ambulatory Visit: Payer: BC Managed Care – PPO | Admitting: Family Medicine

## 2022-05-17 ENCOUNTER — Encounter: Payer: Self-pay | Admitting: Family Medicine

## 2022-05-17 VITALS — BP 122/84 | HR 79 | Ht 62.0 in | Wt 129.0 lb

## 2022-05-17 DIAGNOSIS — M9908 Segmental and somatic dysfunction of rib cage: Secondary | ICD-10-CM

## 2022-05-17 DIAGNOSIS — M545 Low back pain, unspecified: Secondary | ICD-10-CM | POA: Diagnosis not present

## 2022-05-17 DIAGNOSIS — M9901 Segmental and somatic dysfunction of cervical region: Secondary | ICD-10-CM | POA: Diagnosis not present

## 2022-05-17 DIAGNOSIS — M9904 Segmental and somatic dysfunction of sacral region: Secondary | ICD-10-CM | POA: Diagnosis not present

## 2022-05-17 DIAGNOSIS — M9903 Segmental and somatic dysfunction of lumbar region: Secondary | ICD-10-CM

## 2022-05-17 DIAGNOSIS — M9902 Segmental and somatic dysfunction of thoracic region: Secondary | ICD-10-CM

## 2022-05-17 NOTE — Patient Instructions (Signed)
Good to see you! Overall going to do great Continue to be active See you again in 2-3 months

## 2022-05-17 NOTE — Assessment & Plan Note (Signed)
Low back does have some loss of lordosis noted we did have the MRI showing the patient does have an L5-S1 protruding disc that could be potentially causing nerve impingement.  Patient would like to continue with the conservative treatment at the moment including osteopathic manipulation when available.  Discussed with patient about core strengthening still at this time.  Discussed which activities to do and which ones to avoid.  Increase activity slowly.  Follow-up again in 6 to 8 weeks

## 2022-07-18 NOTE — Progress Notes (Unsigned)
  [name] [address] [place] [address] Phone: [phone] Subjective:   Tiffany Myers, am serving as a scribe for Dr. Antoine Primas.  I'm seeing this patient by the request  of:  Shon Hale, MD  CC: Back and neck pain follow-up  Tiffany Myers  Tiffany Myers is a 58 y.o. female coming in with complaint of back and neck pain. OMT 05/17/2022. Patient states doing well. Same per usual. No new concerns.  Has been active overall.  Medications patient has been prescribed: None  Taking:          Past Medical History:  Diagnosis Date   Hepatic adenomas     Allergies  Allergen Reactions   Doxycycline Rash   Penicillins Rash   Sulfa Antibiotics Rash     Review of Systems:  No headache, visual changes, nausea, vomiting, diarrhea, constipation, dizziness, abdominal pain, skin rash, fevers, chills, night sweats, weight loss, swollen lymph nodes, body aches, joint swelling, chest pain, shortness of breath, mood changes. POSITIVE muscle achesBut minimalon tender, no erythema  Low back exam does have some loss of lordosis noted.  Some tenderness to palpation minorly in the sacroiliac joint in the thoracolumbar juncture.  Nothing that is significantly concerning.  Osteopathic findings  C2 flexed rotated and side bent right C7 flexed rotated and side bent left T3 extended rotated and side bent right inhaled rib T11 extended rotated and side bent left L3 flexed rotated and side bent right Sacrum right on right       Assessment and Plan:  Low back pain Patient's low back is doing better overall.  Discussed with patient icing regimen and home exercises.  We discussed continuing on the core strengthening.  Patient has increase activity slowly.  Follow-up with me again in 12 weeks otherwise.    Nonallopathic problems  Decision today to treat with OMT was based on Physical Exam  After verbal consent patient was treated  with HVLA, ME, FPR techniques in cervical, rib, thoracic, lumbar, and sacral  areas  Patient tolerated the procedure well with improvement in symptoms  Patient given exercises, stretches and lifestyle modifications  See medications in patient instructions if given  Patient will follow up in 4-8 weeks     The above documentation has been reviewed and is accurate and complete Judi Saa, DO         Note: This dictation was prepared with Dragon dictation along with smaller phrase technology. Any transcriptional errors that result from this process are unintentional.

## 2022-07-19 ENCOUNTER — Encounter: Payer: Self-pay | Admitting: Family Medicine

## 2022-07-19 ENCOUNTER — Ambulatory Visit: Payer: BC Managed Care – PPO | Admitting: Family Medicine

## 2022-07-19 VITALS — BP 122/74 | HR 78 | Ht 62.0 in | Wt 128.0 lb

## 2022-07-19 DIAGNOSIS — M9901 Segmental and somatic dysfunction of cervical region: Secondary | ICD-10-CM

## 2022-07-19 DIAGNOSIS — M545 Low back pain, unspecified: Secondary | ICD-10-CM

## 2022-07-19 DIAGNOSIS — M9903 Segmental and somatic dysfunction of lumbar region: Secondary | ICD-10-CM

## 2022-07-19 DIAGNOSIS — M9902 Segmental and somatic dysfunction of thoracic region: Secondary | ICD-10-CM

## 2022-07-19 DIAGNOSIS — M9908 Segmental and somatic dysfunction of rib cage: Secondary | ICD-10-CM

## 2022-07-19 DIAGNOSIS — M9904 Segmental and somatic dysfunction of sacral region: Secondary | ICD-10-CM

## 2022-07-19 NOTE — Patient Instructions (Signed)
Good to see you! Whatever you're doing or not doing keep it up See you again in 3 months

## 2022-07-19 NOTE — Assessment & Plan Note (Signed)
Patient's low back is doing better overall.  Discussed with patient icing regimen and home exercises.  We discussed continuing on the core strengthening.  Patient has increase activity slowly.  Follow-up with me again in 12 weeks otherwise.

## 2022-09-01 ENCOUNTER — Other Ambulatory Visit: Payer: Self-pay | Admitting: Internal Medicine

## 2022-09-01 DIAGNOSIS — M810 Age-related osteoporosis without current pathological fracture: Secondary | ICD-10-CM

## 2022-09-14 ENCOUNTER — Ambulatory Visit
Admission: RE | Admit: 2022-09-14 | Discharge: 2022-09-14 | Disposition: A | Discharge status: Home / Self Care | Payer: BC Managed Care – PPO | Source: Ambulatory Visit | Attending: Internal Medicine | Admitting: Internal Medicine

## 2022-09-14 DIAGNOSIS — M810 Age-related osteoporosis without current pathological fracture: Secondary | ICD-10-CM

## 2022-09-14 DIAGNOSIS — E349 Endocrine disorder, unspecified: Secondary | ICD-10-CM | POA: Diagnosis not present

## 2022-09-14 DIAGNOSIS — N958 Other specified menopausal and perimenopausal disorders: Secondary | ICD-10-CM | POA: Diagnosis not present

## 2022-09-14 DIAGNOSIS — M8588 Other specified disorders of bone density and structure, other site: Secondary | ICD-10-CM | POA: Diagnosis not present

## 2022-10-05 DIAGNOSIS — M810 Age-related osteoporosis without current pathological fracture: Secondary | ICD-10-CM | POA: Diagnosis not present

## 2022-10-05 DIAGNOSIS — R748 Abnormal levels of other serum enzymes: Secondary | ICD-10-CM | POA: Diagnosis not present

## 2022-10-05 DIAGNOSIS — R160 Hepatomegaly, not elsewhere classified: Secondary | ICD-10-CM | POA: Diagnosis not present

## 2022-10-17 NOTE — Progress Notes (Unsigned)
  [facility] [address] [place] [address] Phone: [phone] Subjective:   Tiffany Myers, am serving as a scribe for Dr. Antoine Myers.  I'm seeing this patient by the request  of:  Tiffany Hale, MD  CC: Back pain follow-up  [id number]  Tiffany Myers is a 57 y.o. female coming in with complaint of back and neck pain. OMT 07/19/2022. Patient states doing about the same. Endocrinologist wants her to start new medication for bones. Wants your opinion. No other concerns.  Medications patient has been prescribed: None  Taking:         Reviewed prior external information including notes and imaging from previsou exam, outside providers and external EMR if available.   As well as notes that were available from care everywhere and other healthcare systems.  Past medical history, social, surgical and family history all reviewed in electronic medical record.  No pertanent information unless stated regarding to the chief complaint.   Past Medical History:  Diagnosis Date   Hepatic adenomas     Allergies  Allergen Reactions   Doxycycline Rash   Penicillins Rash   Sulfa Antibiotics Rash     Review of Systems:  No headache, visual changes, nausea, vomiting, diarrhea, constipation, dizziness, abdominal pain, skin rash, fevers, chills, night sweats, weight loss, swollen lymph nodes, body aches, joint swelling, chest pain, shortness of breath, mood changes. POSITIVE muscle aches  Objective  Blood pressure 122/86, pulse 81, height 5\' 2"  (1.575 m), weight 125 lb (56.7 kg), last menstrual period 06/16/2020, SpO2 96%, unknown if currently breastfeeding.   General: No apparent distress alert and oriented x3 mood and affect normal, dressed appropriately.  HEENT: Pupils equal, extraocular movements intact  Respiratory: Patient's speak in full sentences and does not appear short of breath  Cardiovascular: No lower extremity edema, non  tender, no erythema  Low back does have some loss of lordosis noted.  Some tenderness to palpation in the paraspinal musculature.  Osteopathic findings  C6 flexed rotated and side bent left T3 extended rotated and side bent right inhaled rib L2 flexed rotated and side bent right L5 flexed rotated and side bent left Sacrum right on right       Assessment and Plan:  Low back pain Low back exam does have some loss of lordosis noted.  Patient still responding extremely well to osteopathic manipulation.  Discussed core strengthening.  Discussed icing regimen.  Continue to stay active.  Patient does have the osteoporosis and will start Fosamax per endocrinologist.  Follow-up again in 6 to 8 weeks otherwise.    Nonallopathic problems  Decision today to treat with OMT was based on Physical Exam  After verbal consent patient was treated with HVLA, ME, FPR techniques in cervical, rib, thoracic, lumbar, and sacral  areas  Patient tolerated the procedure well with improvement in symptoms  Patient given exercises, stretches and lifestyle modifications  See medications in patient instructions if given  Patient will follow up in 4-8 weeks     The above documentation has been reviewed and is accurate and complete Tiffany Saa, DO         Note: This dictation was prepared with Dragon dictation along with smaller phrase technology. Any transcriptional errors that result from this process are unintentional.

## 2022-10-18 ENCOUNTER — Encounter: Payer: Self-pay | Admitting: Family Medicine

## 2022-10-18 ENCOUNTER — Ambulatory Visit: Payer: BC Managed Care – PPO | Admitting: Family Medicine

## 2022-10-18 VITALS — BP 122/86 | HR 81 | Ht 62.0 in | Wt 125.0 lb

## 2022-10-18 DIAGNOSIS — M9903 Segmental and somatic dysfunction of lumbar region: Secondary | ICD-10-CM

## 2022-10-18 DIAGNOSIS — M9908 Segmental and somatic dysfunction of rib cage: Secondary | ICD-10-CM

## 2022-10-18 DIAGNOSIS — M9904 Segmental and somatic dysfunction of sacral region: Secondary | ICD-10-CM | POA: Diagnosis not present

## 2022-10-18 DIAGNOSIS — M9901 Segmental and somatic dysfunction of cervical region: Secondary | ICD-10-CM | POA: Diagnosis not present

## 2022-10-18 DIAGNOSIS — M545 Low back pain, unspecified: Secondary | ICD-10-CM

## 2022-10-18 DIAGNOSIS — M9902 Segmental and somatic dysfunction of thoracic region: Secondary | ICD-10-CM

## 2022-10-18 NOTE — Patient Instructions (Signed)
Good to see you! Think fosamax is a good choice See you again in 2-3 months

## 2022-10-18 NOTE — Assessment & Plan Note (Signed)
Low back exam does have some loss of lordosis noted.  Patient still responding extremely well to osteopathic manipulation.  Discussed core strengthening.  Discussed icing regimen.  Continue to stay active.  Patient does have the osteoporosis and will start Fosamax per endocrinologist.  Follow-up again in 6 to 8 weeks otherwise.

## 2022-11-14 DIAGNOSIS — L820 Inflamed seborrheic keratosis: Secondary | ICD-10-CM | POA: Diagnosis not present

## 2022-11-16 DIAGNOSIS — Z1231 Encounter for screening mammogram for malignant neoplasm of breast: Secondary | ICD-10-CM | POA: Diagnosis not present

## 2022-11-16 DIAGNOSIS — Z01419 Encounter for gynecological examination (general) (routine) without abnormal findings: Secondary | ICD-10-CM | POA: Diagnosis not present

## 2022-11-16 DIAGNOSIS — Z6823 Body mass index (BMI) 23.0-23.9, adult: Secondary | ICD-10-CM | POA: Diagnosis not present

## 2022-11-16 DIAGNOSIS — Z1151 Encounter for screening for human papillomavirus (HPV): Secondary | ICD-10-CM | POA: Diagnosis not present

## 2022-11-16 DIAGNOSIS — Z124 Encounter for screening for malignant neoplasm of cervix: Secondary | ICD-10-CM | POA: Diagnosis not present

## 2022-12-06 DIAGNOSIS — H52223 Regular astigmatism, bilateral: Secondary | ICD-10-CM | POA: Diagnosis not present

## 2022-12-06 DIAGNOSIS — H2513 Age-related nuclear cataract, bilateral: Secondary | ICD-10-CM | POA: Diagnosis not present

## 2022-12-06 DIAGNOSIS — H5213 Myopia, bilateral: Secondary | ICD-10-CM | POA: Diagnosis not present

## 2022-12-06 DIAGNOSIS — H524 Presbyopia: Secondary | ICD-10-CM | POA: Diagnosis not present

## 2022-12-06 DIAGNOSIS — H43823 Vitreomacular adhesion, bilateral: Secondary | ICD-10-CM | POA: Diagnosis not present

## 2022-12-06 DIAGNOSIS — D3131 Benign neoplasm of right choroid: Secondary | ICD-10-CM | POA: Diagnosis not present

## 2022-12-14 DIAGNOSIS — M810 Age-related osteoporosis without current pathological fracture: Secondary | ICD-10-CM | POA: Diagnosis not present

## 2022-12-14 DIAGNOSIS — Z79899 Other long term (current) drug therapy: Secondary | ICD-10-CM | POA: Diagnosis not present

## 2022-12-14 DIAGNOSIS — Z0000 Encounter for general adult medical examination without abnormal findings: Secondary | ICD-10-CM | POA: Diagnosis not present

## 2022-12-14 DIAGNOSIS — E7800 Pure hypercholesterolemia, unspecified: Secondary | ICD-10-CM | POA: Diagnosis not present

## 2022-12-27 DIAGNOSIS — L719 Rosacea, unspecified: Secondary | ICD-10-CM | POA: Diagnosis not present

## 2023-01-12 DIAGNOSIS — Z8349 Family history of other endocrine, nutritional and metabolic diseases: Secondary | ICD-10-CM | POA: Diagnosis not present

## 2023-01-12 IMAGING — US US ABDOMEN LIMITED RUQ/ASCITES
1 series · 14 of 25 positions shown · non-contrast
Comparison: None.

CLINICAL DATA: Elevated alkaline phosphatase

EXAM:
ULTRASOUND ABDOMEN LIMITED RIGHT UPPER QUADRANT

[Series 1: us abdomen limited ruq/ascites · 0.14mm/px · 14 of 62 slices shown]
[im 1/62]
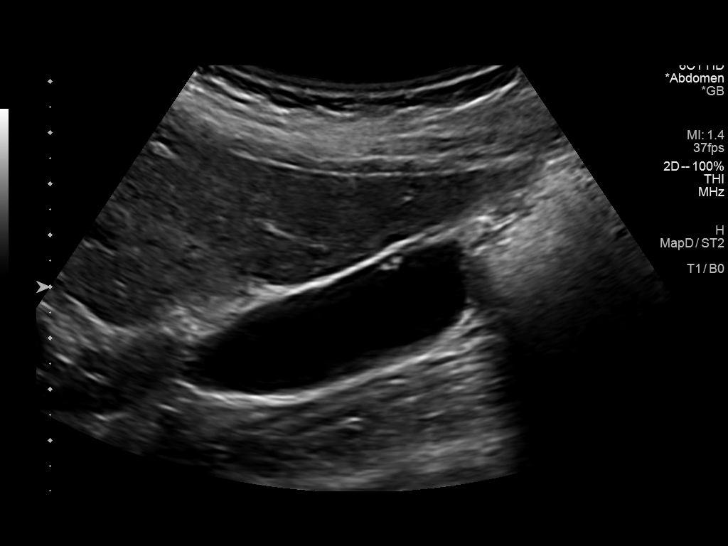
[im 6/62]
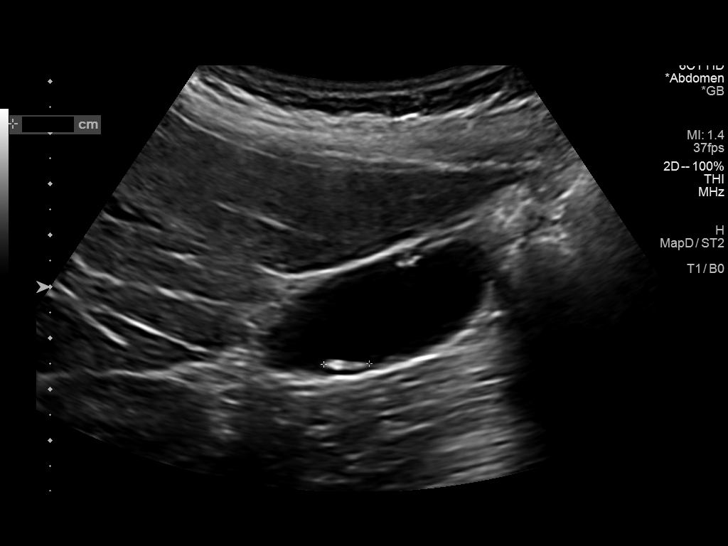
[im 11/62]
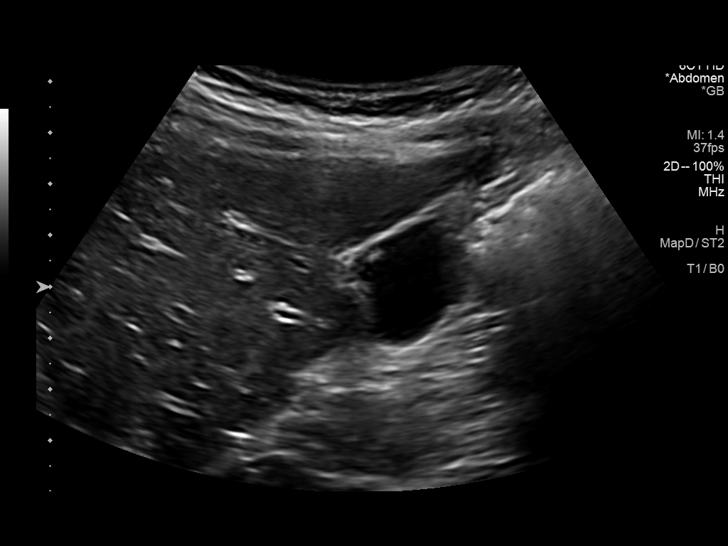
[im 16/62]
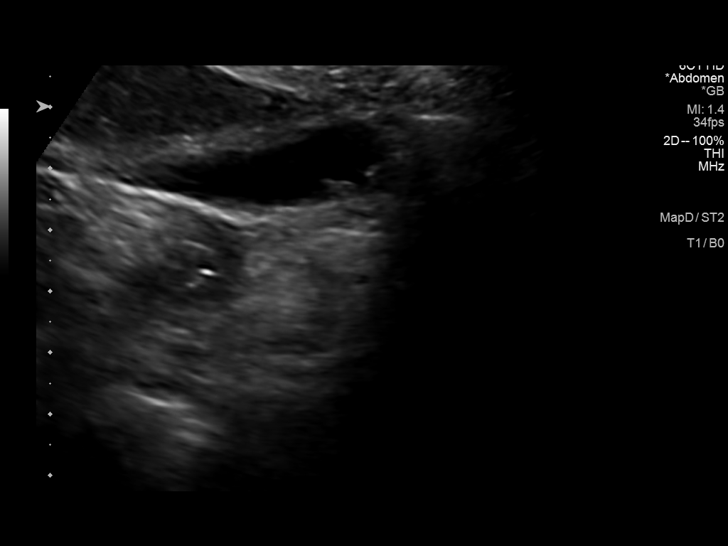
[im 21/62]
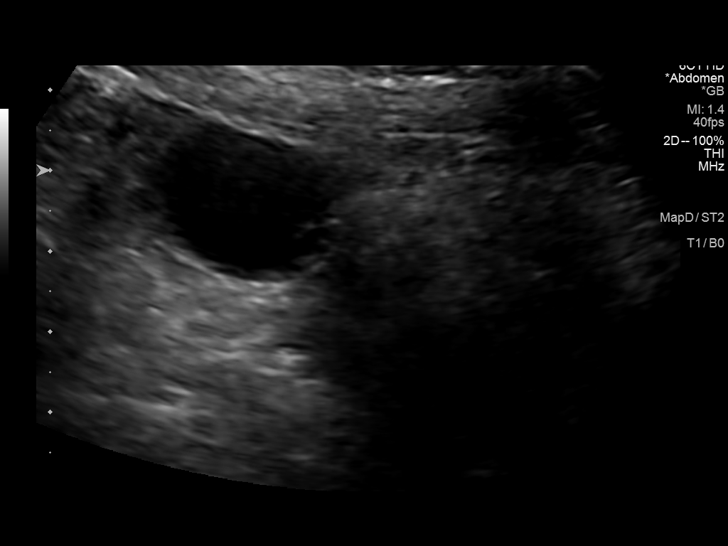
[im 23/62]
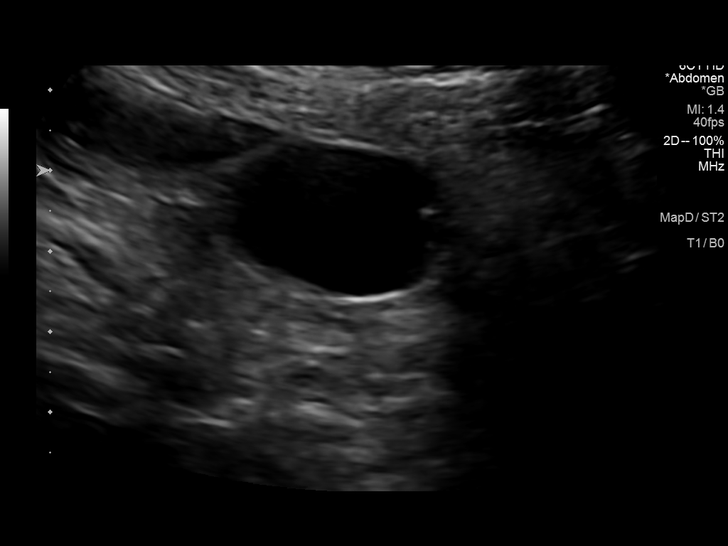
[im 28/62]
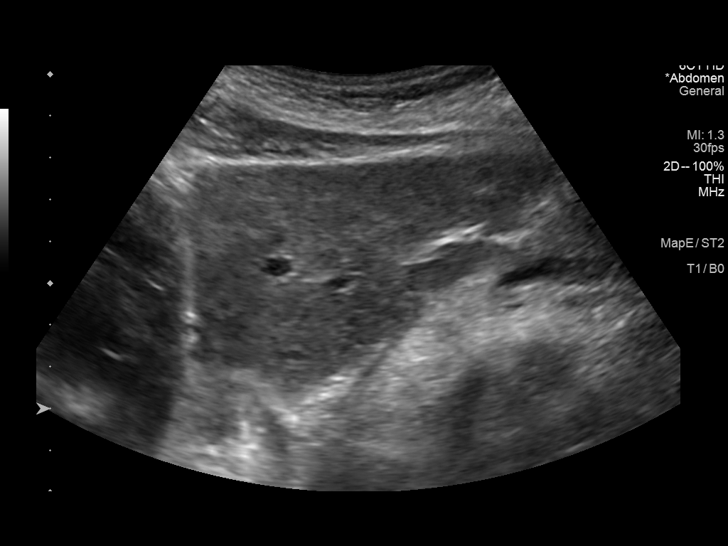
[im 34/62]
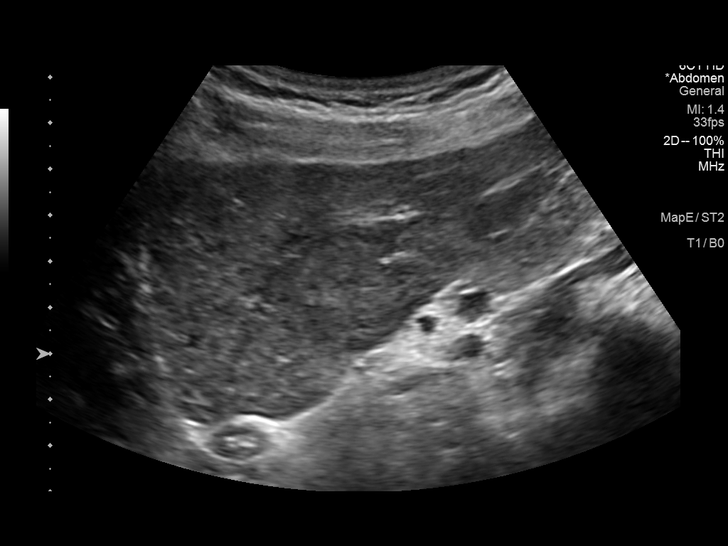
[im 39/62]
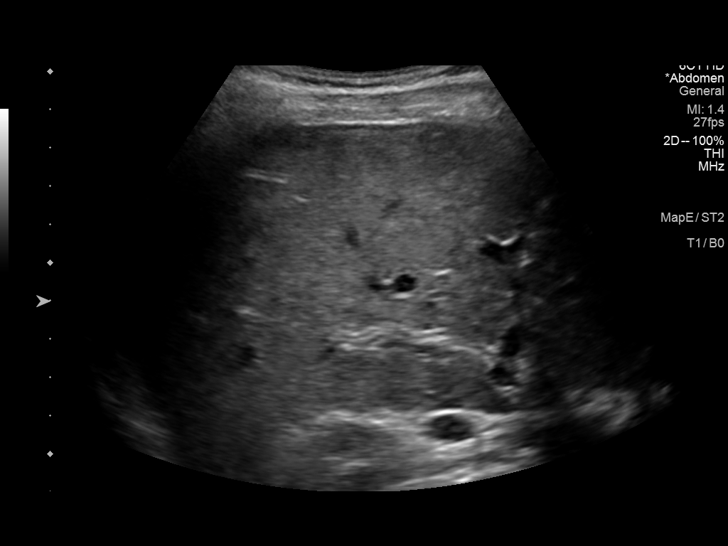
[im 41/62]
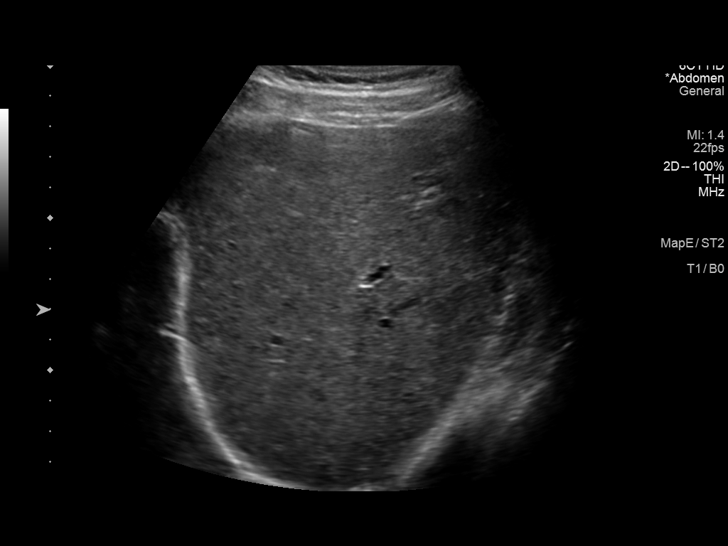
[im 46/62]
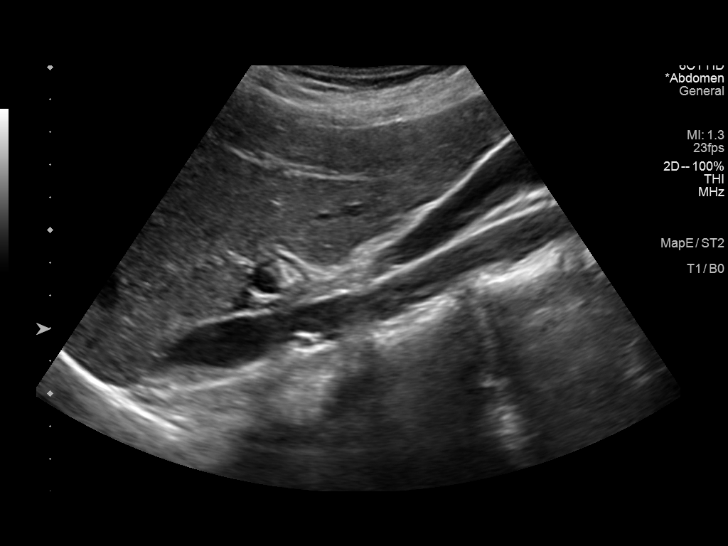
[im 51/62]
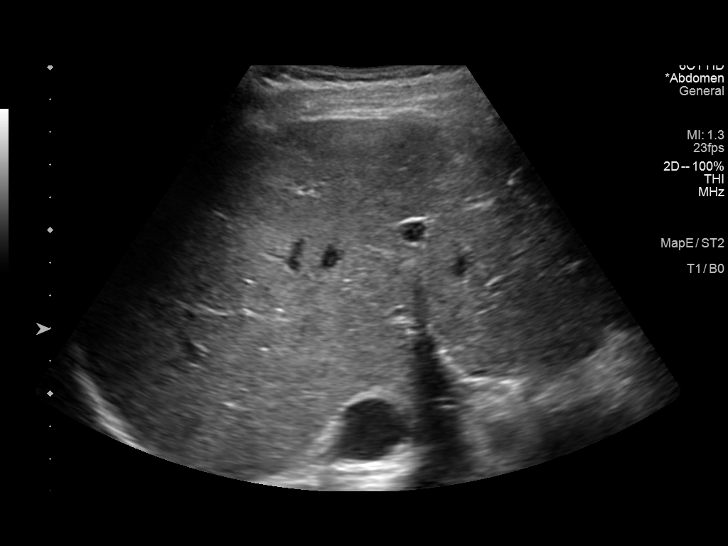
[im 56/62]
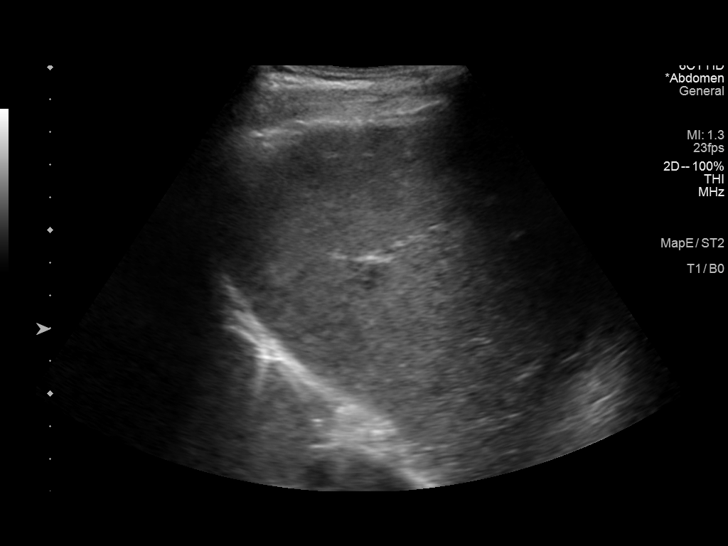
[im 62/62]
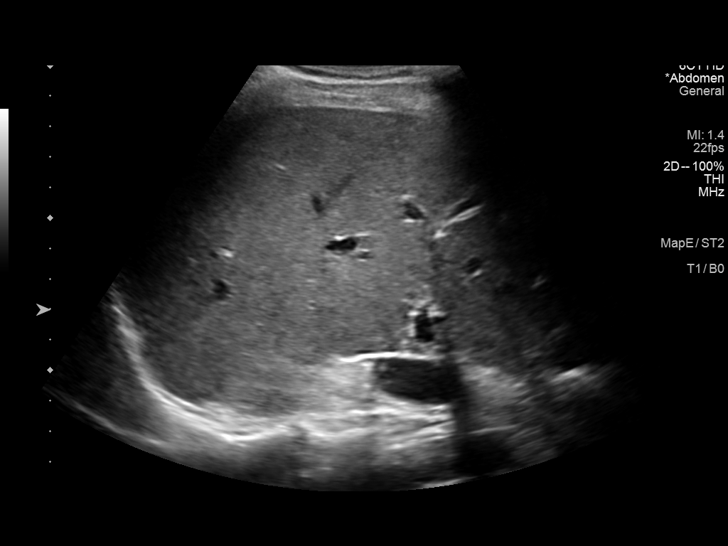

[14 of 25 positions shown; findings below may reference images not displayed]

FINDINGS: Gallbladder:

Multiple nonshadowing calculi are seen within the gallbladder lumen
measuring up to 9 mm in size. No gallbladder wall thickening or
pericholecystic fluid. Negative Murphy sign.

Common bile duct:

Diameter: 2 mm

Liver:

There is an ill-defined isoechoic masslike area within the left lobe
liver measuring 3.5 x 3.0 x 3.6 cm. Dedicated liver MRI is
recommended for complete characterization. The remainder of the
liver parenchyma demonstrates normal echotexture. No intrahepatic
duct dilation. Portal vein is patent on color Doppler imaging with
normal direction of blood flow towards the liver.

Other: None.
IMPRESSION: 1. Likely 3.6 cm left lobe liver mass, for which dedicated liver MRI
is recommended for complete characterization.
2. Cholelithiasis without cholecystitis.

These results will be called to the ordering clinician or
representative by the Radiologist Assistant, and communication
documented in the PACS or [REDACTED].

## 2023-01-16 NOTE — Progress Notes (Unsigned)
  [facility] [address] [place] [address] Phone: [phone] Subjective:   Tiffany Myers, am serving as a scribe for Dr. Antoine Myers.  I'm seeing this patient by the request  of:  Tiffany Hale, MD  CC: Back and neck pain follow-up  [id number]  Tiffany Myers is a 57 y.o. female coming in with complaint of back and neck pain. OMT 10/18/2022. Patient states doing relatively good.  No significant discomfort or pain.  Nothing that stopping her from activities at the moment.  Has gabapentin but not taking it  Medications patient has been prescribed: None       Reviewed prior external information including notes and imaging from previsou exam, outside providers and external EMR if available.   As well as notes that were available from care everywhere and other healthcare systems.  Past medical history, social, surgical and family history all reviewed in electronic medical record.  No pertanent information unless stated regarding to the chief complaint.   Past Medical History:  Diagnosis Date   Hepatic adenomas     Allergies  Allergen Reactions   Doxycycline Rash   Penicillins Rash   Sulfa Antibiotics Rash     Review of Systems:  No headache, visual changes, nausea, vomiting, diarrhea, constipation, dizziness, abdominal pain, skin rash, fevers, chills, night sweats, weight loss, swollen lymph nodes, body aches, joint swelling, chest pain, shortness of breath, mood changes. POSITIVE muscle aches  Objective  Blood pressure 102/82, pulse 76, height 5\' 2"  (1.575 m), weight 127 lb (57.6 kg), last menstrual period 06/16/2020, SpO2 98%, unknown if currently breastfeeding.   General: No apparent distress alert and oriented x3 mood and affect normal, dressed appropriately.  HEENT: Pupils equal, extraocular movements intact  Respiratory: Patient's speak in full sentences and does not appear short of breath  Cardiovascular: No lower  extremity edema, non tender, no erythema  Neck exam does have some loss lordosis noted.  Some tenderness to palpation in the paraspinal musculature  Osteopathic findings  C2 flexed rotated and side bent right C5 flexed rotated and side bent left T3 extended rotated and side bent right inhaled rib T9 extended rotated and side bent left L5 flexed rotated and side bent left  Sacrum right on right     Assessment and Plan:  Low back pain Mild tightness overall but nothing severe at the moment.  Patient is able to stay extremely active.  Bonding well to the osteopathic manipulation.  Follow-up with me again in 2 to 3 months.    Nonallopathic problems  Decision today to treat with OMT was based on Physical Exam  After verbal consent patient was treated with HVLA, ME, FPR techniques in cervical, rib, thoracic, lumbar, and sacral  areas  Patient tolerated the procedure well with improvement in symptoms  Patient given exercises, stretches and lifestyle modifications  See medications in patient instructions if given  Patient will follow up in 4-8 weeks     The above documentation has been reviewed and is accurate and complete Tiffany Saa, DO         Note: This dictation was prepared with Dragon dictation along with smaller phrase technology. Any transcriptional errors that result from this process are unintentional.

## 2023-01-17 ENCOUNTER — Encounter: Payer: Self-pay | Admitting: Family Medicine

## 2023-01-17 ENCOUNTER — Ambulatory Visit: Payer: BC Managed Care – PPO | Admitting: Family Medicine

## 2023-01-17 VITALS — BP 102/82 | HR 76 | Ht 62.0 in | Wt 127.0 lb

## 2023-01-17 DIAGNOSIS — M9901 Segmental and somatic dysfunction of cervical region: Secondary | ICD-10-CM

## 2023-01-17 DIAGNOSIS — M9904 Segmental and somatic dysfunction of sacral region: Secondary | ICD-10-CM | POA: Diagnosis not present

## 2023-01-17 DIAGNOSIS — M9902 Segmental and somatic dysfunction of thoracic region: Secondary | ICD-10-CM | POA: Diagnosis not present

## 2023-01-17 DIAGNOSIS — M9908 Segmental and somatic dysfunction of rib cage: Secondary | ICD-10-CM | POA: Diagnosis not present

## 2023-01-17 DIAGNOSIS — M545 Low back pain, unspecified: Secondary | ICD-10-CM | POA: Diagnosis not present

## 2023-01-17 DIAGNOSIS — M9903 Segmental and somatic dysfunction of lumbar region: Secondary | ICD-10-CM

## 2023-01-17 NOTE — Patient Instructions (Signed)
Good to see you Have a great holiday season See me in 10-12 weeks

## 2023-01-17 NOTE — Assessment & Plan Note (Signed)
Mild tightness overall but nothing severe at the moment.  Patient is able to stay extremely active.  Bonding well to the osteopathic manipulation.  Follow-up with me again in 2 to 3 months.

## 2023-02-16 IMAGING — US US BIOPSY CORE LIVER
1 series · 13 of 20 positions shown · non-contrast
Comparison: none

INDICATION: 54-year-old female with a history of multiple liver lesions,
possible hepatic adenoma, referred for biopsy

[Series 1: us biopsy (liver) · 13 of 20 slices shown]
[im 1/20]
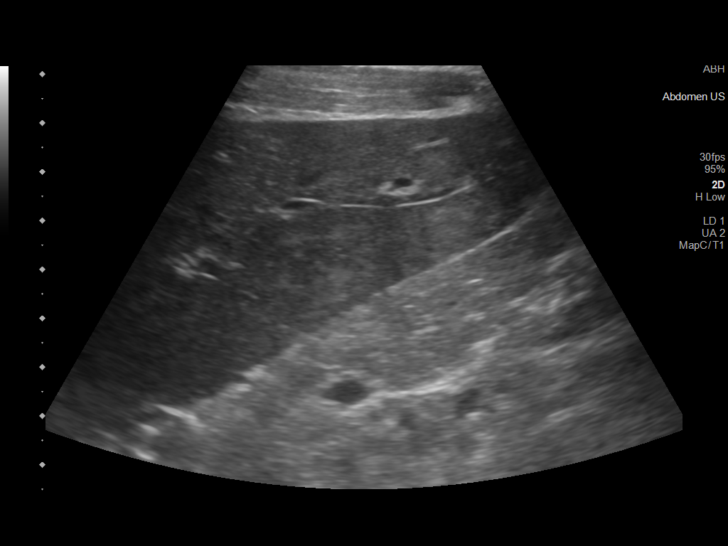
[im 3/20]
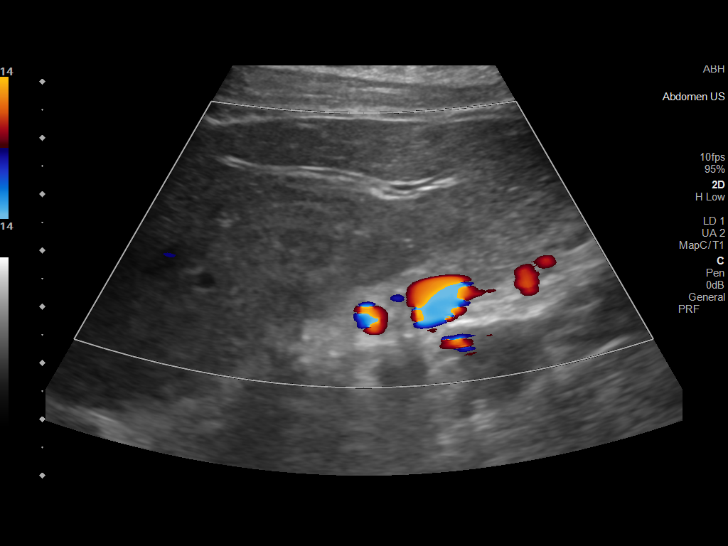
[im 4/20]
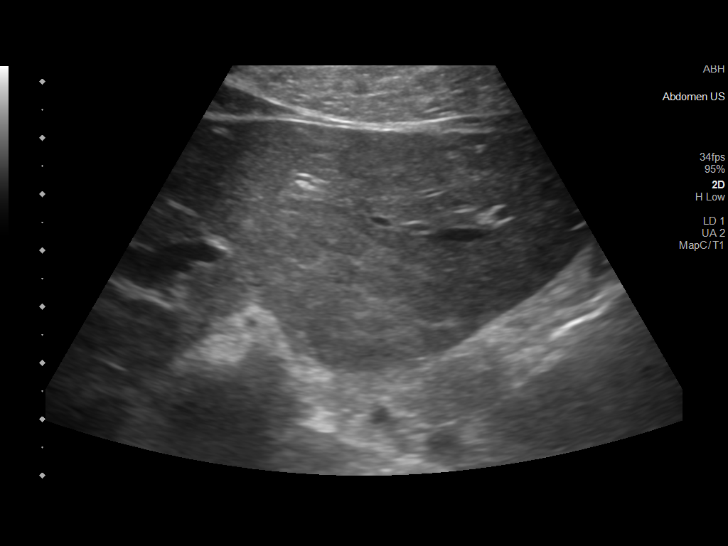
[im 6/20]
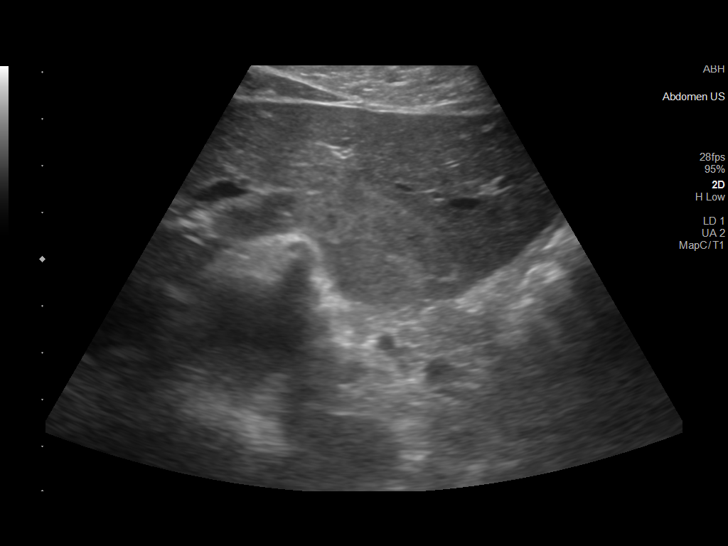
[im 7/20]
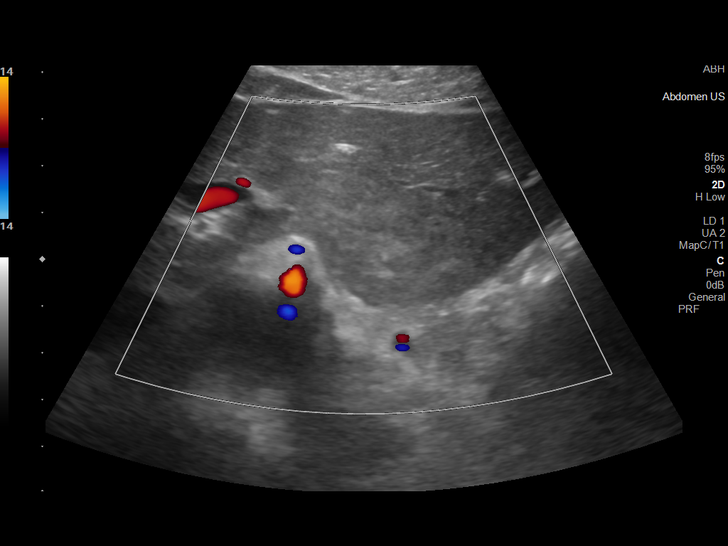
[im 9/20]
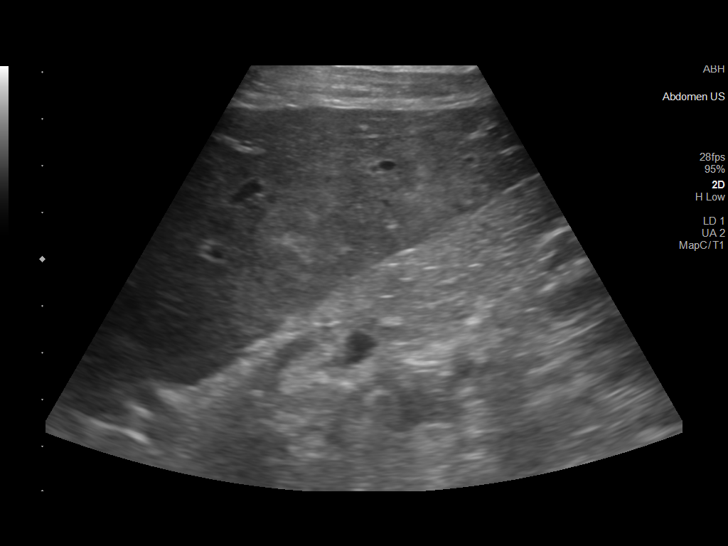
[im 11/20]
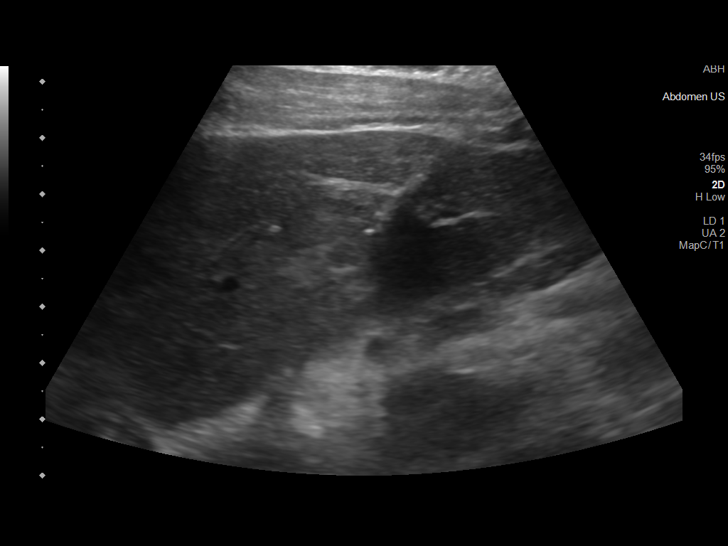
[im 12/20]
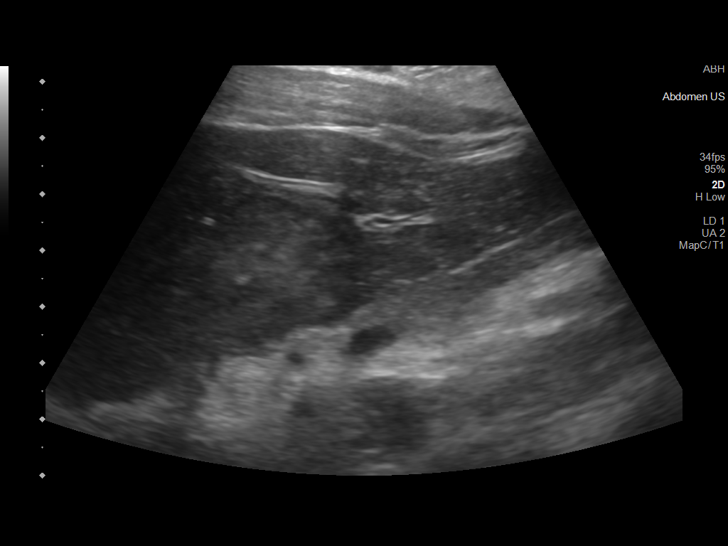
[im 14/20]
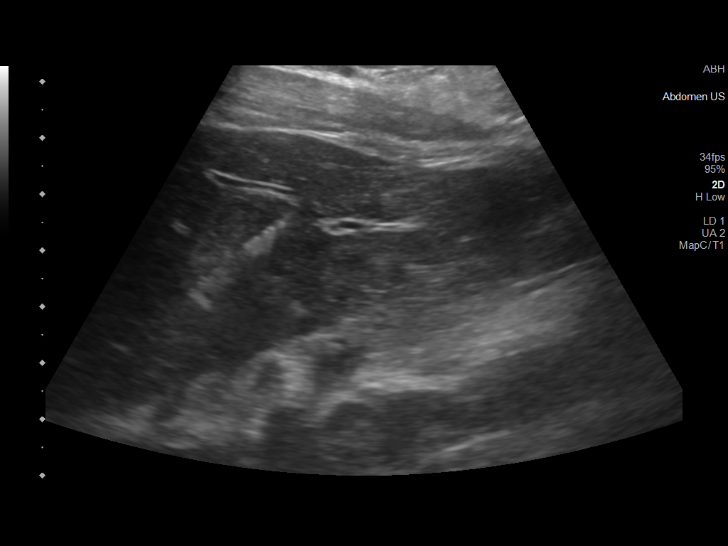
[im 15/20]
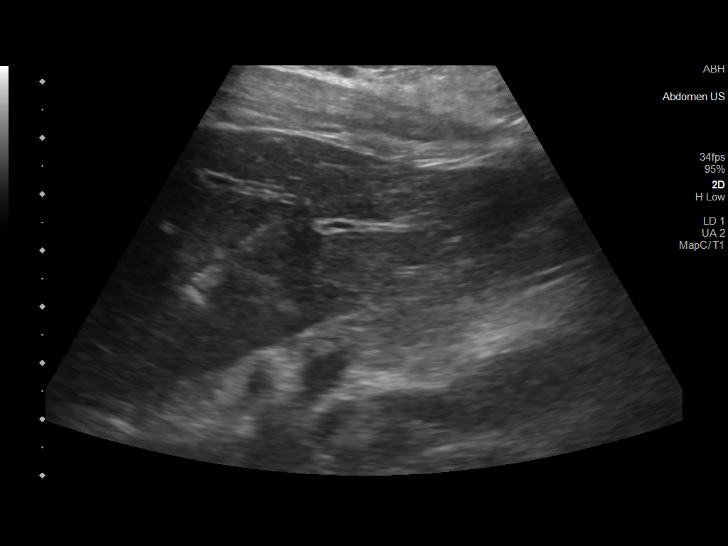
[im 17/20]
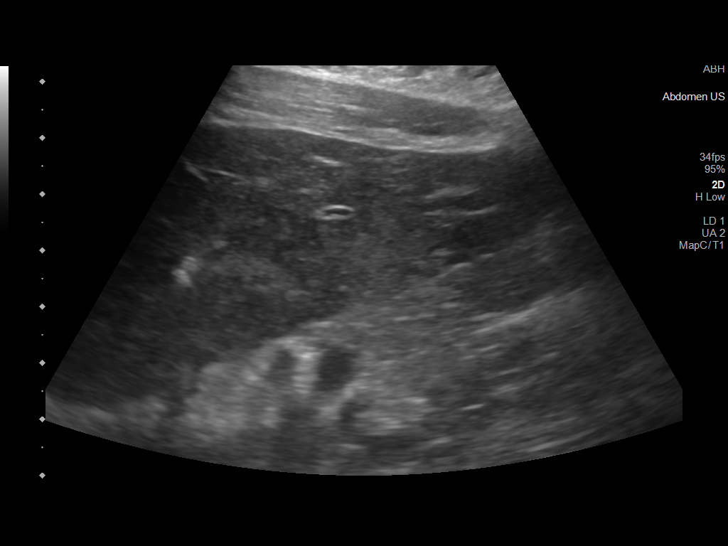
[im 18/20]
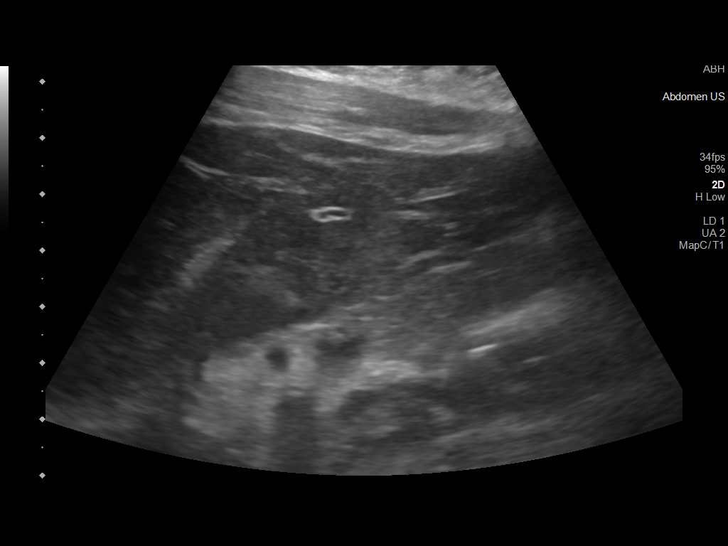
[im 20/20]
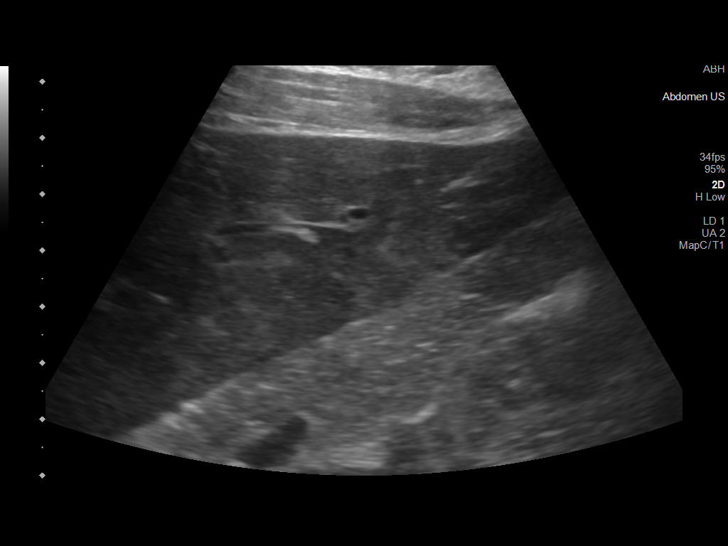

[13 of 20 positions shown; findings below may reference images not displayed]

EXAM:
ULTRASOUND-GUIDED LIVER MASS BIOPSY

MEDICATIONS:
None.

ANESTHESIA/SEDATION:
Moderate (conscious) sedation was employed during this procedure. A
total of Versed 1 mg and Fentanyl 50 mcg was administered
intravenously.

Moderate Sedation Time: 12 minutes. The patient's level of
consciousness and vital signs were monitored continuously by
radiology nursing throughout the procedure under my direct
supervision.

FLUOROSCOPY TIME:  None

COMPLICATIONS:
None

PROCEDURE:
Informed written consent was obtained from the patient after a
thorough discussion of the procedural risks, benefits and
alternatives. All questions were addressed. Maximal Sterile Barrier
Technique was utilized including caps, mask, sterile gowns, sterile
gloves, sterile drape, hand hygiene and skin antiseptic. A timeout
was performed prior to the initiation of the procedure.

Ultrasound survey of the right liver lobe performed with images
stored and sent to PACs.

The epigastric region was prepped with chlorhexidine in a sterile
fashion, and a sterile drape was applied covering the operative
field. A sterile gown and sterile gloves were used for the
procedure. Local anesthesia was provided with 1% Lidocaine.

The patient was prepped and draped sterilely and the skin and
subcutaneous tissues were generously infiltrated with 1% lidocaine.

A 17 gauge introducer needle was then advanced under ultrasound
guidance in an intercostal location into the left liver lobe,
targeting mass within the posterior liver lobe. The stylet was
removed, and multiple separate 18 gauge core biopsy were retrieved.
Samples were placed into formalin for transportation to the lab.

Gel-Foam pledgets were then infused with a small amount of saline
for assistance with hemostasis.

The needle was removed, and a final ultrasound image was performed.

The patient tolerated the procedure well and remained
hemodynamically stable throughout.

No complications were encountered and no significant blood loss was
encounter.
IMPRESSION: Status post ultrasound-guided biopsy of left liver mass. Tissue
specimen sent to pathology for complete histopathologic analysis.

## 2023-03-09 DIAGNOSIS — D225 Melanocytic nevi of trunk: Secondary | ICD-10-CM | POA: Diagnosis not present

## 2023-03-09 DIAGNOSIS — L578 Other skin changes due to chronic exposure to nonionizing radiation: Secondary | ICD-10-CM | POA: Diagnosis not present

## 2023-03-09 DIAGNOSIS — L570 Actinic keratosis: Secondary | ICD-10-CM | POA: Diagnosis not present

## 2023-03-09 DIAGNOSIS — L719 Rosacea, unspecified: Secondary | ICD-10-CM | POA: Diagnosis not present

## 2023-03-13 ENCOUNTER — Other Ambulatory Visit: Payer: Self-pay | Admitting: Surgery

## 2023-03-13 DIAGNOSIS — D134 Benign neoplasm of liver: Secondary | ICD-10-CM

## 2023-04-10 NOTE — Progress Notes (Unsigned)
[name] [address] [place] [address] Phone: [phone] Subjective:   INadine Counts, am serving as a scribe for Dr. Antoine Primas.  I'm seeing this patient by the request  of:  Shon Hale, MD  CC: Back and neck pain follow-up  [id number]  Tiffany Myers is a 58 y.o. female coming in with complaint of back and neck pain. OMT 01/17/2023. Patient states   Medications patient has been prescribed: None  Taking:         Reviewed prior external information including notes and imaging from previsou exam, outside providers and external EMR if available.   As well as notes that were available from care everywhere and other healthcare systems.  Past medical history, social, surgical and family history all reviewed in electronic medical record.  No pertanent information unless stated regarding to the chief complaint.   Past Medical History:  Diagnosis Date   Hepatic adenomas     Allergies  Allergen Reactions   Doxycycline Rash   Penicillins Rash   Sulfa Antibiotics Rash     Review of Systems:  No headache, visual changes, nausea, vomiting, diarrhea, constipation, dizziness, abdominal pain, skin rash, fevers, chills, night sweats, weight loss, swollen lymph nodes, body aches, joint swelling, chest pain, shortness of breath, mood changes. POSITIVE muscle aches  Objective  Blood pressure 122/74, height 5\' 2"  (1.575 m), weight 130 lb (59 kg), last menstrual period 06/16/2020, unknown if currently breastfeeding.   General: No apparent distress alert and oriented x3 mood and affect normal, dressed appropriately.  HEENT: Pupils equal, extraocular movements intact  Respiratory: Patient's speak in full sentences and does not appear short of breath  Cardiovascular: No lower extremity edema, non tender, no erythema  Gait MSK:  Back does have some loss lordosis noted.  Some tenderness to palpation in the paraspinal  musculature.  Some tightness noted with the paraspinal musculature mostly on the left side which is somewhat different.  Negative straight leg test noted.  Osteopathic findings  C2 flexed rotated and side bent left C6 flexed rotated and side bent left T3 extended rotated and side bent right inhaled rib T9 extended rotated and side bent left L2 flexed rotated and side bent right Sacrum left on left       Assessment and Plan:  Low back pain Low back exam does have some loss lordosis noted.  Some tenderness to palpation of the paraspinal musculature.  Discussed icing regimen.  Patient is going to be going on a pilgrimage in the near future.  Will continue to monitor.  Follow-up again in 2 months for further evaluation.  May need to consider the possibility of some medications for any acute flare during her trip.    Nonallopathic problems  Decision today to treat with OMT was based on Physical Exam  After verbal consent patient was treated with HVLA, ME, FPR techniques in cervical, rib, thoracic, lumbar, and sacral  areas  Patient tolerated the procedure well with improvement in symptoms  Patient given exercises, stretches and lifestyle modifications  See medications in patient instructions if given  Patient will follow up in 4-8 weeks     The above documentation has been reviewed and is accurate and complete Judi Saa, DO         Note: This dictation was prepared with Dragon dictation along with smaller phrase technology. Any transcriptional errors that result from this process are unintentional.

## 2023-04-11 ENCOUNTER — Encounter: Payer: Self-pay | Admitting: Surgery

## 2023-04-11 ENCOUNTER — Ambulatory Visit: Payer: BC Managed Care – PPO | Admitting: Family Medicine

## 2023-04-11 ENCOUNTER — Encounter: Payer: Self-pay | Admitting: Family Medicine

## 2023-04-11 VITALS — BP 122/74 | Ht 62.0 in | Wt 130.0 lb

## 2023-04-11 DIAGNOSIS — M9901 Segmental and somatic dysfunction of cervical region: Secondary | ICD-10-CM | POA: Diagnosis not present

## 2023-04-11 DIAGNOSIS — M9902 Segmental and somatic dysfunction of thoracic region: Secondary | ICD-10-CM | POA: Diagnosis not present

## 2023-04-11 DIAGNOSIS — M9904 Segmental and somatic dysfunction of sacral region: Secondary | ICD-10-CM

## 2023-04-11 DIAGNOSIS — M9908 Segmental and somatic dysfunction of rib cage: Secondary | ICD-10-CM | POA: Diagnosis not present

## 2023-04-11 DIAGNOSIS — M545 Low back pain, unspecified: Secondary | ICD-10-CM

## 2023-04-11 DIAGNOSIS — M9903 Segmental and somatic dysfunction of lumbar region: Secondary | ICD-10-CM

## 2023-04-11 NOTE — Assessment & Plan Note (Signed)
Low back exam does have some loss lordosis noted.  Some tenderness to palpation of the paraspinal musculature.  Discussed icing regimen.  Patient is going to be going on a pilgrimage in the near future.  Will continue to monitor.  Follow-up again in 2 months for further evaluation.  May need to consider the possibility of some medications for any acute flare during her trip.

## 2023-04-11 NOTE — Patient Instructions (Signed)
Good to see you! Let's make no big changes Have an appointment in 2 months just in case

## 2023-04-15 ENCOUNTER — Ambulatory Visit
Admission: RE | Admit: 2023-04-15 | Discharge: 2023-04-15 | Disposition: A | Discharge status: Home / Self Care | Payer: BC Managed Care – PPO | Source: Ambulatory Visit | Attending: Surgery | Admitting: Surgery

## 2023-04-15 DIAGNOSIS — K8020 Calculus of gallbladder without cholecystitis without obstruction: Secondary | ICD-10-CM | POA: Diagnosis not present

## 2023-04-15 DIAGNOSIS — K7689 Other specified diseases of liver: Secondary | ICD-10-CM | POA: Diagnosis not present

## 2023-04-15 DIAGNOSIS — I700 Atherosclerosis of aorta: Secondary | ICD-10-CM | POA: Diagnosis not present

## 2023-04-15 DIAGNOSIS — D134 Benign neoplasm of liver: Secondary | ICD-10-CM

## 2023-04-15 MED ORDER — GADOPICLENOL 0.5 MMOL/ML IV SOLN
6.0000 mL | Freq: Once | INTRAVENOUS | Status: AC | PRN
  Administered 2023-04-15: 6 mL via INTRAVENOUS

## 2023-05-11 DIAGNOSIS — H2513 Age-related nuclear cataract, bilateral: Secondary | ICD-10-CM | POA: Diagnosis not present

## 2023-05-11 DIAGNOSIS — H43823 Vitreomacular adhesion, bilateral: Secondary | ICD-10-CM | POA: Diagnosis not present

## 2023-05-11 DIAGNOSIS — H35361 Drusen (degenerative) of macula, right eye: Secondary | ICD-10-CM | POA: Diagnosis not present

## 2023-05-11 DIAGNOSIS — D3131 Benign neoplasm of right choroid: Secondary | ICD-10-CM | POA: Diagnosis not present

## 2023-05-14 ENCOUNTER — Encounter: Payer: Self-pay | Admitting: Family Medicine

## 2023-05-15 ENCOUNTER — Other Ambulatory Visit

## 2023-05-15 ENCOUNTER — Other Ambulatory Visit: Payer: Self-pay

## 2023-05-15 DIAGNOSIS — M25569 Pain in unspecified knee: Secondary | ICD-10-CM

## 2023-05-16 ENCOUNTER — Encounter: Payer: Self-pay | Admitting: Family Medicine

## 2023-05-16 LAB — D-DIMER, QUANTITATIVE: D-Dimer, Quant: <0.19 ug{FEU}/mL (ref <0.50)

## 2023-06-07 NOTE — Progress Notes (Signed)
  Tiffany Myers [address] [place] [address] Phone: [phone] Subjective:   Tiffany Myers, am serving as a scribe for Dr. Ronnell Myers.  I'm seeing this patient by the request  of:  Tiffany Byers, MD  CC: Back and neck pain follow-up  [id number]  Tiffany Myers is a 58 y.o. female coming in with complaint of back and neck pain. OMT 04/11/2023. Patient states that she has been more active and pain in L glute, groin, and hamstring increased. Wants to know if she should push through this pain and continue to exercise. Getting ready to go on pilgrimage and leaves next Wednesday.   Medications patient has been prescribed: None  Taking: No         Reviewed prior external information including notes and imaging from previsou exam, outside providers and external EMR if available.   As well as notes that were available from care everywhere and other healthcare systems.  Past medical history, social, surgical and family history all reviewed in electronic medical record.  No pertanent information unless stated regarding to the chief complaint.   Past Medical History:  Diagnosis Date   Hepatic adenomas     Allergies  Allergen Reactions   Doxycycline Rash   Penicillins Rash   Sulfa Antibiotics Rash     Review of Systems:  No headache, visual changes, nausea, vomiting, diarrhea, constipation, dizziness, abdominal pain, skin rash, fevers, chills, night sweats, weight loss, swollen lymph nodes, body aches, joint swelling, chest pain, shortness of breath, mood changes. POSITIVE muscle aches  Objective  Last menstrual period 06/16/2020, unknown if currently breastfeeding.   General: No apparent distress alert and oriented x3 mood and affect normal, dressed appropriately.  HEENT: Pupils equal, extraocular movements intact  Respiratory: Patient's speak in full sentences and does not appear short of breath  Cardiovascular: No lower  extremity edema, non tender, no erythema  Gait MSK:  Back does have some loss lordosis noted.  Some tenderness to palpation in the paraspinal musculature.  More pain over the sacroiliac joint right greater than left.  Osteopathic findings  C7 flexed rotated and side bent right C6 flexed rotated and side bent left T3 extended rotated and side bent right inhaled rib L2 flexed rotated and side bent right L5 flexed rotated and side bent left Sacrum right on right       Assessment and Plan:  No problem-specific Assessment & Plan notes found for this encounter.    Nonallopathic problems  Decision today to treat with OMT was based on Physical Exam  After verbal consent patient was treated with HVLA, ME, FPR techniques in cervical, rib, thoracic, lumbar, and sacral  areas  Patient tolerated the procedure well with improvement in symptoms  Patient given exercises, stretches and lifestyle modifications  See medications in patient instructions if given  Patient will follow up in 4-8 weeks     The above documentation has been reviewed and is accurate and complete Tiffany Roat [name], DO         Note: This dictation was prepared with Dragon dictation along with smaller phrase technology. Any transcriptional errors that result from this process are unintentional.

## 2023-06-18 ENCOUNTER — Encounter: Payer: Self-pay | Admitting: Family Medicine

## 2023-06-18 ENCOUNTER — Ambulatory Visit: Payer: BC Managed Care – PPO | Admitting: Family Medicine

## 2023-06-18 VITALS — BP 128/98 | HR 84 | Ht 62.0 in | Wt 128.0 lb

## 2023-06-18 DIAGNOSIS — M9902 Segmental and somatic dysfunction of thoracic region: Secondary | ICD-10-CM

## 2023-06-18 DIAGNOSIS — M9901 Segmental and somatic dysfunction of cervical region: Secondary | ICD-10-CM

## 2023-06-18 DIAGNOSIS — M545 Low back pain, unspecified: Secondary | ICD-10-CM | POA: Diagnosis not present

## 2023-06-18 DIAGNOSIS — M9903 Segmental and somatic dysfunction of lumbar region: Secondary | ICD-10-CM | POA: Diagnosis not present

## 2023-06-18 DIAGNOSIS — M9908 Segmental and somatic dysfunction of rib cage: Secondary | ICD-10-CM

## 2023-06-18 DIAGNOSIS — M9904 Segmental and somatic dysfunction of sacral region: Secondary | ICD-10-CM | POA: Diagnosis not present

## 2023-06-18 MED ORDER — PREDNISONE 20 MG PO TABS: 40.0000 mg | ORAL_TABLET | Freq: Every day | ORAL | 0 refills | Status: DC

## 2023-06-18 NOTE — Assessment & Plan Note (Addendum)
 Patient is doing relatively well overall.  Discussed icing regimen and home exercises, discussed which activities to do and which ones to avoid.  Discussed icing regimen.  Follow-up again in 6 to 8 weeks otherwise.  Follow-up with me again in 6 to 8 weeks otherwise.  Patient will be traveling and given prednisone .  Discussed with patient about when to use it if necessary.

## 2023-06-18 NOTE — Patient Instructions (Signed)
 Good to see you Have a great trip Pred 40mg  for 5 days See me again in 6-8 weeks

## 2023-08-07 NOTE — Progress Notes (Unsigned)
  [facility] [address] [place] [address] Phone: [phone] Subjective:   IBryan Myers, am serving as a scribe for Dr. Ronnell Coins.  I'm seeing this patient by the request  of:  Ransom Byers, MD  CC: Back and neck pain follow-up  [id number]  Tiffany Myers is a 58 y.o. female coming in with complaint of back and neck pain.OMT 06/18/2023. Patient states doing well. Gets aggravated since starting to exercise again.  Patient states nothing that stopping her from activity.  No radiation down the leg.  Mild tightness on the left side lower back.  Medications patient has been prescribed: None  Taking:         Reviewed prior external information including notes and imaging from previsou exam, outside providers and external EMR if available.   As well as notes that were available from care everywhere and other healthcare systems.  Past medical history, social, surgical and family history all reviewed in electronic medical record.  No pertanent information unless stated regarding to the chief complaint.   Past Medical History:  Diagnosis Date   Hepatic adenomas     Allergies  Allergen Reactions   Doxycycline Rash   Penicillins Rash   Sulfa Antibiotics Rash     Review of Systems:  No headache, visual changes, nausea, vomiting, diarrhea, constipation, dizziness, abdominal pain, skin rash, fevers, chills, night sweats, weight loss, swollen lymph nodes, body aches, joint swelling, chest pain, shortness of breath, mood changes. POSITIVE muscle aches  Objective  Blood pressure 128/86, pulse 80, height 5' 2 (1.575 m), weight 132 lb (59.9 kg), last menstrual period 06/16/2020, SpO2 98%, unknown if currently breastfeeding.   General: No apparent distress alert and oriented x3 mood and affect normal, dressed appropriately.  HEENT: Pupils equal, extraocular movements intact  Respiratory: Patient's speak in full sentences and does  not appear short of breath  Cardiovascular: No lower extremity edema, non tender, no erythema  Gait MSK:  Back low back does have some loss lordosis noted.  Some tenderness to palpation noted.  Tightness with FABER left greater than right.  Osteopathic findings  T9 extended rotated and side bent left L2 flexed rotated and side bent right L3 and L4 flexed rotated and side bent left Sacrum right on right       Assessment and Plan:  Low back pain Discussed icing regimen and home exercises.  Discussed posture and ergonomics.  Increase activity slowly otherwise.  Discussed which activities to do and which ones to avoid.  Increase activity slowly.  Follow-up again in 8 to 12 weeks weeks patient does do a lot of of standing at work that could potentially be causing some of the discomfort as well.    Nonallopathic problems  Decision today to treat with OMT was based on Physical Exam  After verbal consent patient was treated with HVLA, ME, FPR techniques in thoracic, lumbar, and sacral  areas  Patient tolerated the procedure well with improvement in symptoms  Patient given exercises, stretches and lifestyle modifications  See medications in patient instructions if given  Patient will follow up in 4-8 weeks    The above documentation has been reviewed and is accurate and complete Tanyia Grabbe M Sundiata Ferrick, DO          Note: This dictation was prepared with Dragon dictation along with smaller phrase technology. Any transcriptional errors that result from this process are unintentional.

## 2023-08-08 ENCOUNTER — Encounter: Payer: Self-pay | Admitting: Family Medicine

## 2023-08-08 ENCOUNTER — Ambulatory Visit: Admitting: Family Medicine

## 2023-08-08 VITALS — BP 128/86 | HR 80 | Ht 62.0 in | Wt 132.0 lb

## 2023-08-08 DIAGNOSIS — M9902 Segmental and somatic dysfunction of thoracic region: Secondary | ICD-10-CM

## 2023-08-08 DIAGNOSIS — M9903 Segmental and somatic dysfunction of lumbar region: Secondary | ICD-10-CM | POA: Diagnosis not present

## 2023-08-08 DIAGNOSIS — M545 Low back pain, unspecified: Secondary | ICD-10-CM

## 2023-08-08 DIAGNOSIS — M9904 Segmental and somatic dysfunction of sacral region: Secondary | ICD-10-CM | POA: Diagnosis not present

## 2023-08-08 NOTE — Assessment & Plan Note (Signed)
 Discussed icing regimen and home exercises.  Discussed posture and ergonomics.  Increase activity slowly otherwise.  Discussed which activities to do and which ones to avoid.  Increase activity slowly.  Follow-up again in 8 to 12 weeks weeks patient does do a lot of of standing at work that could potentially be causing some of the discomfort as well.

## 2023-08-08 NOTE — Patient Instructions (Signed)
 Good to see you! Try to steam the clothes right in front of you instead of rotating Sitting position See you again in 2 months

## 2023-10-03 NOTE — Progress Notes (Unsigned)
  [facility] [address] [place] [address] Phone: [phone] Subjective:   LILLETTE Berwyn Posey, am serving as a scribe for Dr. Arthea Claudene.  I'm seeing this patient by the request  of:  Chrystal Lamarr RAMAN, MD  CC: Back and neck pain follow-up  YEP:Dlagzrupcz  Tiffany Myers is a 58 y.o. female coming in with complaint of back and neck pain. OMT 08/08/2023. Patient states she has been doing great since last visit.   Medications patient has been prescribed: None  Taking:         Reviewed prior external information including notes and imaging from previsou exam, outside providers and external EMR if available.   As well as notes that were available from care everywhere and other healthcare systems.  Past medical history, social, surgical and family history all reviewed in electronic medical record.  No pertanent information unless stated regarding to the chief complaint.   Past Medical History:  Diagnosis Date   Hepatic adenomas     Allergies  Allergen Reactions   Doxycycline Rash   Penicillins Rash   Sulfa Antibiotics Rash     Review of Systems:  No headache, visual changes, nausea, vomiting, diarrhea, constipation, dizziness, abdominal pain, skin rash, fevers, chills, night sweats, weight loss, swollen lymph nodes, body aches, joint swelling, chest pain, shortness of breath, mood changes. POSITIVE muscle aches  Objective  Blood pressure 112/80, pulse (!) 51, height 5' 2 (1.575 m), weight 132 lb (59.9 kg), last menstrual period 06/16/2020, SpO2 98%, unknown if currently breastfeeding.   General: No apparent distress alert and oriented x3 mood and affect normal, dressed appropriately.  HEENT: Pupils equal, extraocular movements intact  Respiratory: Patient's speak in full sentences and does not appear short of breath  Cardiovascular: No lower extremity edema, non tender, no erythema  Gait MSK:  Back does have some mild loss  lordosis.  Some tenderness to palpation from the thoracolumbar juncture into the sacroiliac area bilaterally.  Tightness with Deri right greater than left.  Osteopathic findings  C6 flexed rotated and side bent left T3 extended rotated and side bent right inhaled rib T9 extended rotated and side bent left L2 flexed rotated and side bent right L5 flexed rotated and side bent left Sacrum right on right       Assessment and Plan:  Low back pain Chronic but stable at this time.  Responding extremely well though to the conservative therapy at the moment.  Able to increase interval between evaluations.  Follow-up again in 6 to 8 weeks    Nonallopathic problems  Decision today to treat with OMT was based on Physical Exam  After verbal consent patient was treated with HVLA, ME, FPR techniques in cervical, rib, thoracic, lumbar, and sacral  areas  Patient tolerated the procedure well with improvement in symptoms  Patient given exercises, stretches and lifestyle modifications  See medications in patient instructions if given  Patient will follow up in 4-8 weeks      The above documentation has been reviewed and is accurate and complete Waylon Hershey M Reizy Dunlow, DO        Note: This dictation was prepared with Dragon dictation along with smaller phrase technology. Any transcriptional errors that result from this process are unintentional.

## 2023-10-05 ENCOUNTER — Ambulatory Visit: Admitting: Family Medicine

## 2023-10-05 ENCOUNTER — Encounter: Payer: Self-pay | Admitting: Family Medicine

## 2023-10-05 VITALS — BP 112/80 | HR 51 | Ht 62.0 in | Wt 132.0 lb

## 2023-10-05 DIAGNOSIS — M9901 Segmental and somatic dysfunction of cervical region: Secondary | ICD-10-CM | POA: Diagnosis not present

## 2023-10-05 DIAGNOSIS — M810 Age-related osteoporosis without current pathological fracture: Secondary | ICD-10-CM | POA: Diagnosis not present

## 2023-10-05 DIAGNOSIS — M545 Low back pain, unspecified: Secondary | ICD-10-CM

## 2023-10-05 DIAGNOSIS — M9904 Segmental and somatic dysfunction of sacral region: Secondary | ICD-10-CM | POA: Diagnosis not present

## 2023-10-05 DIAGNOSIS — M9908 Segmental and somatic dysfunction of rib cage: Secondary | ICD-10-CM

## 2023-10-05 DIAGNOSIS — M9903 Segmental and somatic dysfunction of lumbar region: Secondary | ICD-10-CM

## 2023-10-05 DIAGNOSIS — R748 Abnormal levels of other serum enzymes: Secondary | ICD-10-CM | POA: Diagnosis not present

## 2023-10-05 DIAGNOSIS — Z8349 Family history of other endocrine, nutritional and metabolic diseases: Secondary | ICD-10-CM | POA: Diagnosis not present

## 2023-10-05 DIAGNOSIS — M9902 Segmental and somatic dysfunction of thoracic region: Secondary | ICD-10-CM | POA: Diagnosis not present

## 2023-10-05 DIAGNOSIS — R160 Hepatomegaly, not elsewhere classified: Secondary | ICD-10-CM | POA: Diagnosis not present

## 2023-10-05 NOTE — Patient Instructions (Signed)
 Great to see you You are doing amazing See me in 10-12 weeks

## 2023-10-05 NOTE — Assessment & Plan Note (Signed)
 Chronic but stable at this time.  Responding extremely well though to the conservative therapy at the moment.  Able to increase interval between evaluations.  Follow-up again in 6 to 8 weeks

## 2023-11-12 IMAGING — MR MR ABDOMEN WO/W CM
11 of 18 series · 25 of 48 positions shown · IV contrast (multihance)
Comparison: Multiple priors including most recent MRI October 17, 2020

CLINICAL DATA: Follow-up hepatic adenomatosis.

EXAM:
MRI ABDOMEN WITHOUT AND WITH CONTRAST
TECHNIQUE: Multiplanar multisequence MR imaging of the abdomen was performed
both before and after the administration of intravenous contrast.
CONTRAST:  11mL MULTIHANCE GADOBENATE DIMEGLUMINE 529 MG/ML IV SOLN

[Series 3: cor haste · coronal · 5.0mm · 0.70mm/px · 2 of 29 slices shown]
[im 1/29]
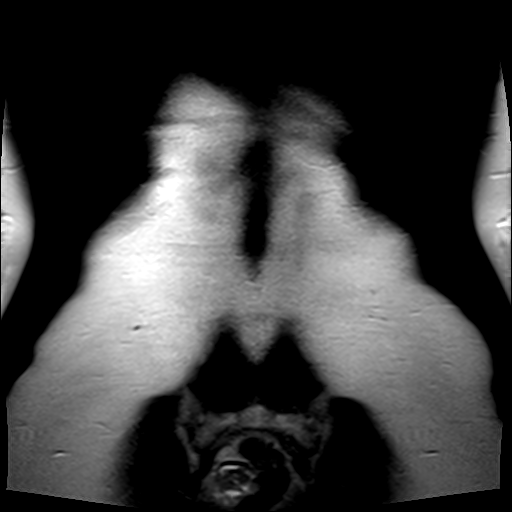
[im 29/29]
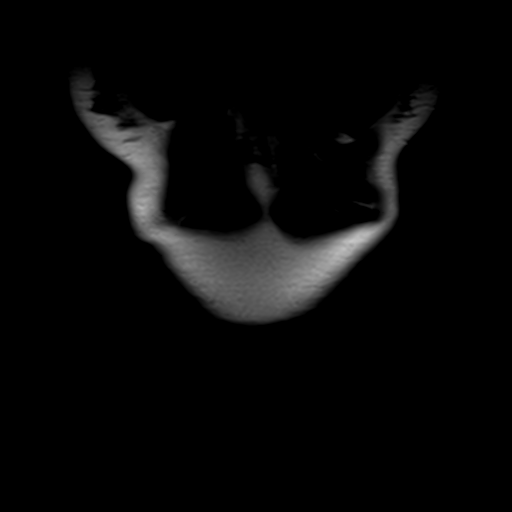

[Series 4: axial haste · axial · 6.0mm · 0.66mm/px · z∈[-105,+126]mm · 2 of 36 slices shown]
[im 1/36]
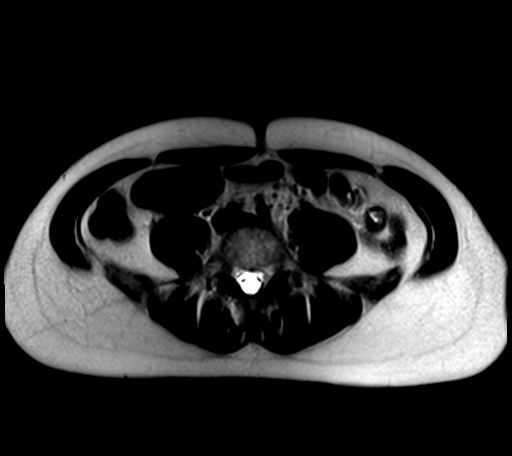
[im 36/36]
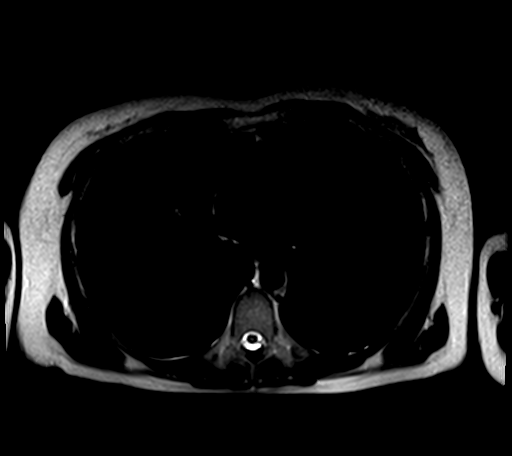

[Series 5: T1 · axial · 6.0mm · 0.68mm/px · z∈[-105,+126]mm · 3 of 72 slices shown]
[im 1/72]
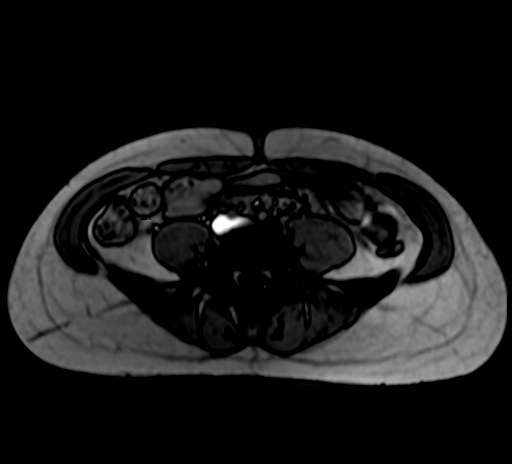
[im 36/72]
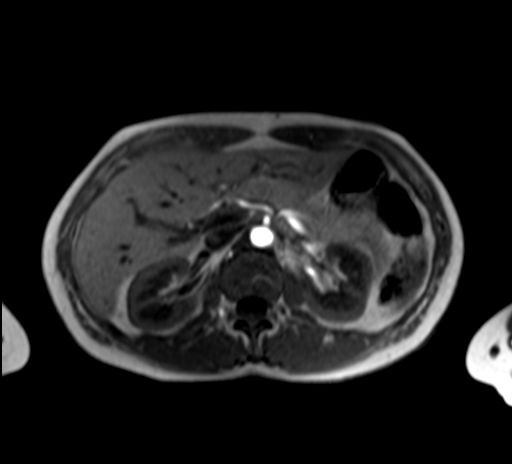
[im 72/72]
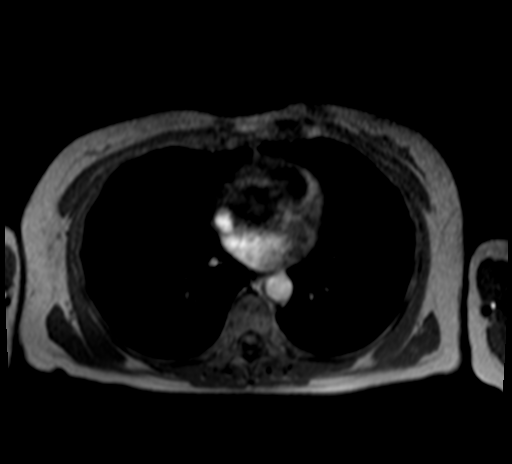

[Series 6: bSSFP · axial · 4.0mm · 0.66mm/px · z∈[-106,+126]mm · 2 of 59 slices shown]
[im 1/59]
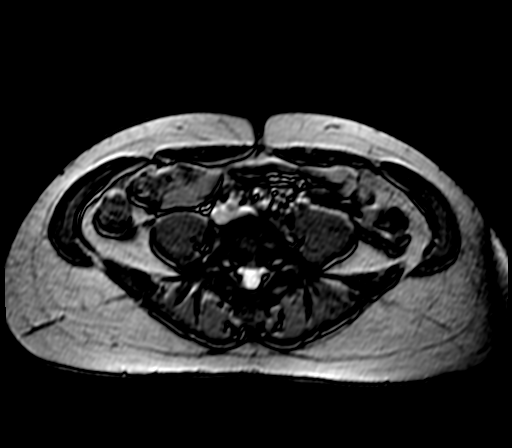
[im 59/59]
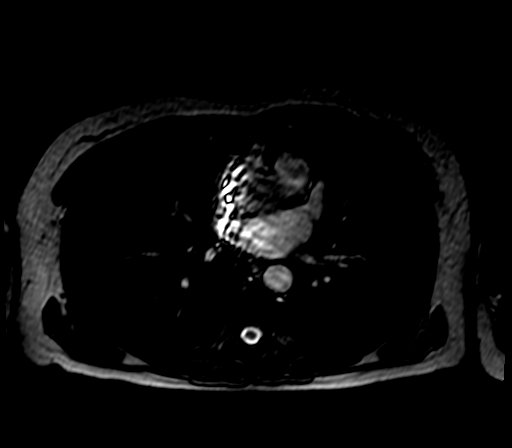

[Series 7: T2 fat-sat · axial · 6.0mm · 1.06mm/px · 1 of 35 slices shown]
[im 1/35]
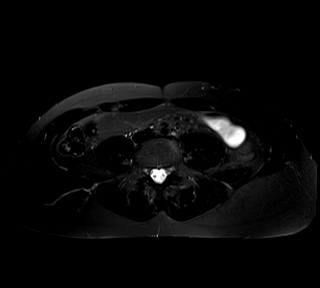

[Series 8: [id number]_[id number]_trig · axial · 6.0mm · 1.82mm/px · z∈[-98,+147]mm · 4 of 105 slices shown]
[im 1/105]
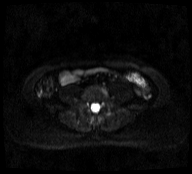
[im 35/105]
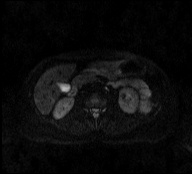
[im 70/105]
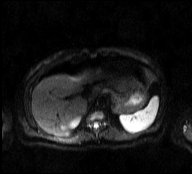
[im 105/105]
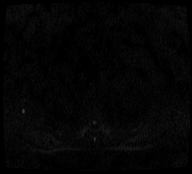

[Series 9: [id number]_trig_adc · axial · 6.0mm · 1.82mm/px · 1 of 35 slices shown]
[im 1/35]
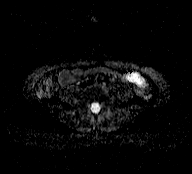

[Series 10: T1 dynamic · axial · non-contrast · 3.0mm · 0.66mm/px · z∈[-96,+117]mm · 3 of 72 slices shown (1 of 2)]
[im 1/72]
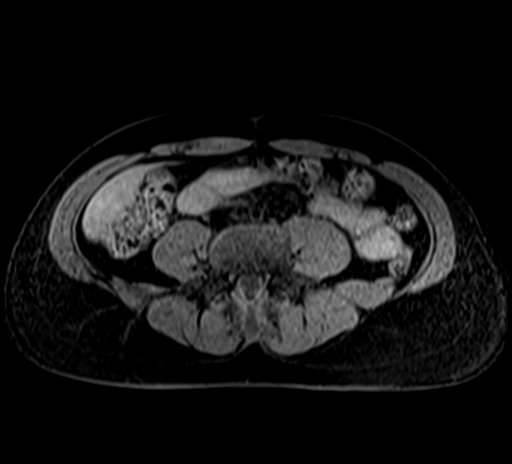
[im 36/72]
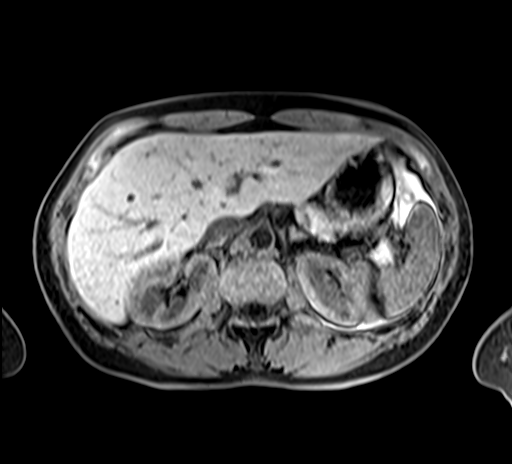
[im 72/72]
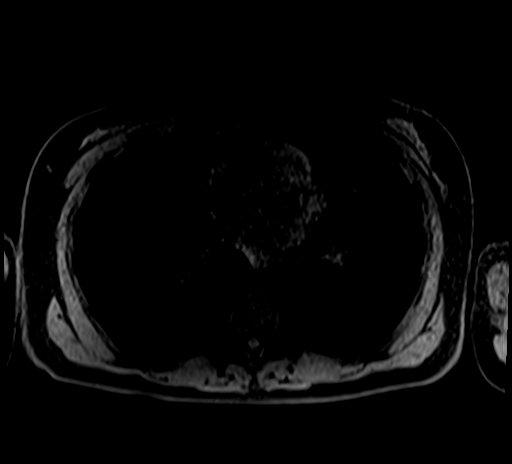

[Series 11: T1 dynamic · axial · non-contrast · 3.0mm · 0.66mm/px · z∈[-108,+129]mm · 3 of 80 slices shown (2 of 2)]
[im 1/80]
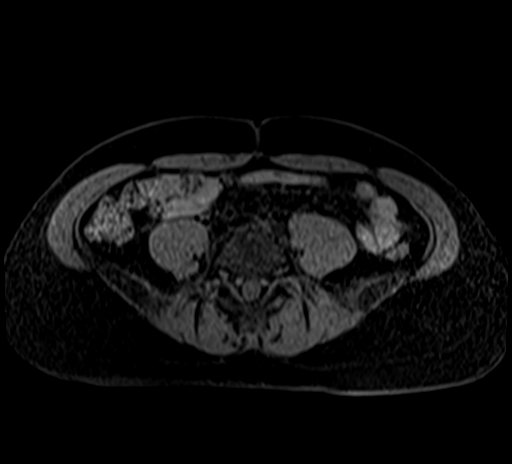
[im 40/80]
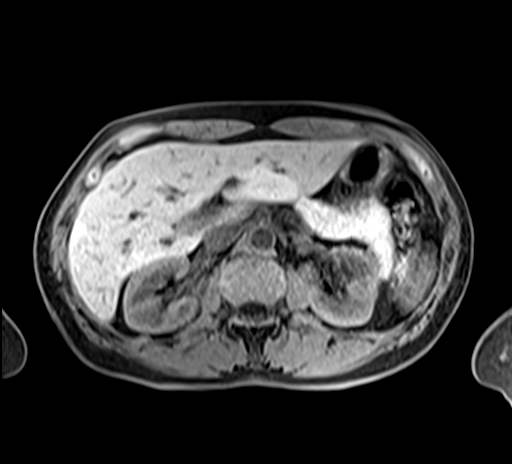
[im 80/80]
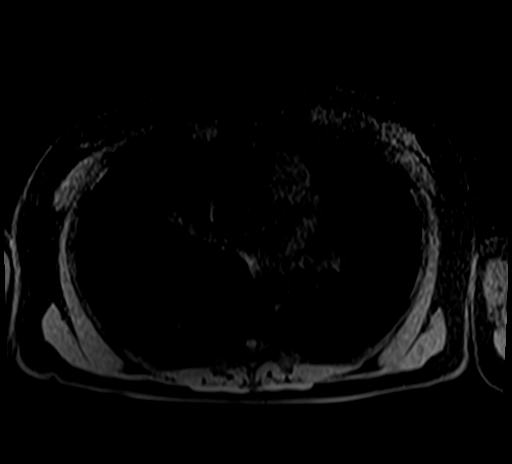

[Series 12: T1 dynamic post-contrast · axial · 3.0mm · 0.66mm/px · z∈[-108,+129]mm · 3 of 80 slices shown (1 of 2)]
[im 1/80]
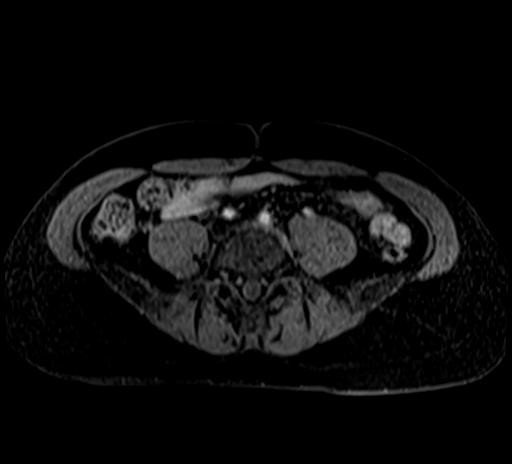
[im 40/80]
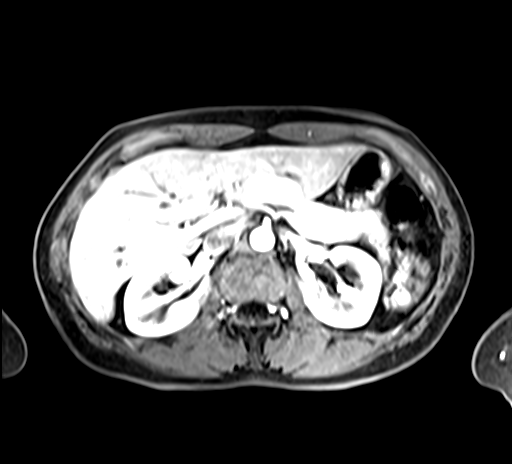
[im 80/80]
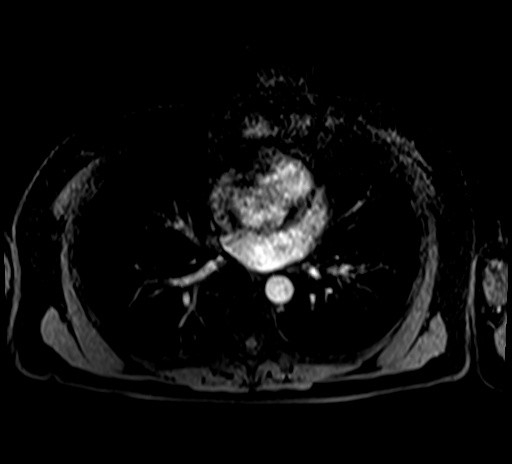

[Series 13: T1 dynamic post-contrast · axial · 3.0mm · 0.66mm/px · 1 of 80 slices shown (2 of 2)]
[im 1/80]
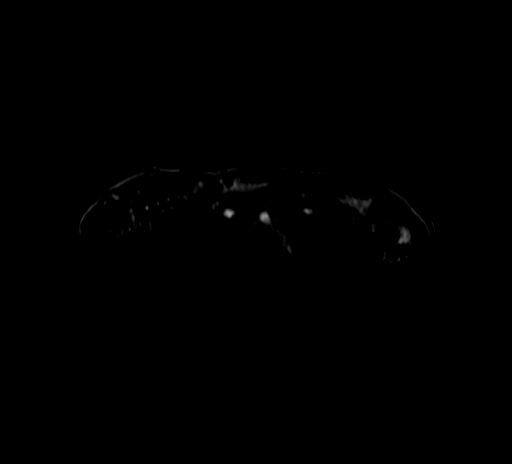

[25 of 48 positions shown; findings below may reference images not displayed]

FINDINGS: Lower chest: No acute finding.

Hepatobiliary: No significant hepatic steatosis.

Interval decrease in size in the innumerable bilobar arterially
enhancing hepatic lesions, previously biopsied with pathology
diagnosis of hepatic adenomas. Index lesions are as follows:

-dominant lesion in the left lobe of the liver measures 3.8 x 2.1 cm
on image 39/12 previously measuring 4.1 x 2.2 cm when remeasured for
consistency.

-reference lesion in the right lobe of the liver measures 1.2 x
cm on image 37/12 previously 1.5 x 1.1 cm when remeasured for
consistency.

No new hepatic lesion identified.

Gallbladder is unremarkable.  No biliary ductal dilation.

Pancreas: Intrinsic T1 signal of the pancreatic parenchyma is within
normal limits. Homogeneous enhancement of the pancreas postcontrast
administration. No pancreatic ductal dilation. No cystic or solid
hyperenhancing pancreatic lesions identified.

Spleen: Unchanged size of the 2 fluid signal cystic splenic lesions
which demonstrate thin enhancing septations consistent with benign
lymphangiomas and measuring up to 12 mm on image [DATE].

Adrenals/Urinary Tract: No masses identified. No evidence of
hydronephrosis.

Stomach/Bowel: Visualized portions within the abdomen are
unremarkable.

Vascular/Lymphatic: Normal caliber abdominal aorta. No
pathologically enlarged abdominal lymph nodes.

Other:  No abdominal free fluid.

Musculoskeletal: No suspicious bone lesions identified.
IMPRESSION: Interval decrease in size of the biopsy-proven bilobar hepatic
adenomas.

## 2023-11-21 DIAGNOSIS — Z01419 Encounter for gynecological examination (general) (routine) without abnormal findings: Secondary | ICD-10-CM | POA: Diagnosis not present

## 2023-11-21 DIAGNOSIS — Z6824 Body mass index (BMI) 24.0-24.9, adult: Secondary | ICD-10-CM | POA: Diagnosis not present

## 2023-12-03 DIAGNOSIS — Z1231 Encounter for screening mammogram for malignant neoplasm of breast: Secondary | ICD-10-CM | POA: Diagnosis not present

## 2023-12-10 DIAGNOSIS — H43823 Vitreomacular adhesion, bilateral: Secondary | ICD-10-CM | POA: Diagnosis not present

## 2023-12-10 DIAGNOSIS — H5213 Myopia, bilateral: Secondary | ICD-10-CM | POA: Diagnosis not present

## 2023-12-10 DIAGNOSIS — H52223 Regular astigmatism, bilateral: Secondary | ICD-10-CM | POA: Diagnosis not present

## 2023-12-10 DIAGNOSIS — D3131 Benign neoplasm of right choroid: Secondary | ICD-10-CM | POA: Diagnosis not present

## 2023-12-10 DIAGNOSIS — H2513 Age-related nuclear cataract, bilateral: Secondary | ICD-10-CM | POA: Diagnosis not present

## 2023-12-10 DIAGNOSIS — H524 Presbyopia: Secondary | ICD-10-CM | POA: Diagnosis not present

## 2023-12-13 DIAGNOSIS — E7800 Pure hypercholesterolemia, unspecified: Secondary | ICD-10-CM | POA: Diagnosis not present

## 2023-12-13 DIAGNOSIS — Z79899 Other long term (current) drug therapy: Secondary | ICD-10-CM | POA: Diagnosis not present

## 2023-12-13 DIAGNOSIS — M810 Age-related osteoporosis without current pathological fracture: Secondary | ICD-10-CM | POA: Diagnosis not present

## 2023-12-13 NOTE — Progress Notes (Signed)
  [facility] [address] [place] [address] Phone: [phone] Subjective:   Tiffany Myers, am serving as a scribe for Dr. Arthea Myers.  I'Tiffany seeing this patient by the request  of:  Chrystal Lamarr RAMAN, MD  CC: Back and neck pain follow-up  YEP:Dlagzrupcz  Tiffany Myers is a 58 y.o. female coming in with complaint of back and neck pain. OMT 10/05/2023. Patient states that she has been doing well. Intermittent L side lower back pain with working out.   Medications patient has been prescribed: None  Taking:         Reviewed prior external information including notes and imaging from previsou exam, outside providers and external EMR if available.   As well as notes that were available from care everywhere and other healthcare systems.  Past medical history, social, surgical and family history all reviewed in electronic medical record.  No pertanent information unless stated regarding to the chief complaint.   Past Medical History:  Diagnosis Date   Hepatic adenomas     Allergies  Allergen Reactions   Doxycycline Rash   Penicillins Rash   Sulfa Antibiotics Rash     Review of Systems:  No headache, visual changes, nausea, vomiting, diarrhea, constipation, dizziness, abdominal pain, skin rash, fevers, chills, night sweats, weight loss, swollen lymph nodes, body aches, joint swelling, chest pain, shortness of breath, mood changes. POSITIVE muscle aches  Objective  Blood pressure 112/76, pulse 93, height 5' 2 (1.575 Tiffany), weight 131 lb (59.4 kg), last menstrual period 06/16/2020, SpO2 95%, unknown if currently breastfeeding.   General: No apparent distress alert and oriented x3 mood and affect normal, dressed appropriately.  HEENT: Pupils equal, extraocular movements intact  Respiratory: Patient's speak in full sentences and does not appear short of breath  Cardiovascular: No lower extremity edema, non tender, no erythema   Gait MSK:  Back does have some loss lordosis noted.  Some tenderness to palpation in the paraspinal musculature.  Tightness with FABER right greater than left.  Osteopathic findings  C2 flexed rotated and side bent right C6 flexed rotated and side bent left T3 extended rotated and side bent right inhaled rib T9 extended rotated and side bent left L2 flexed rotated and side bent right L3 flexed rotated and side bent left Sacrum right on right     Assessment and Plan:  Low back pain Low back is doing really well overall.  Discussed icing regimen and home exercises.  Can follow-up with me again in 3 to 6 months.  Patient is doing well with the conservative therapy.    Nonallopathic problems  Decision today to treat with OMT was based on Physical Exam  After verbal consent patient was treated with HVLA, ME, FPR techniques in cervical, rib, thoracic, lumbar, and sacral  areas  Patient tolerated the procedure well with improvement in symptoms  Patient given exercises, stretches and lifestyle modifications  See medications in patient instructions if given  Patient will follow up in 4-8 weeks     The above documentation has been reviewed and is accurate and complete Tiffany Myers Tiffany [name], DO         Note: This dictation was prepared with Dragon dictation along with smaller phrase technology. Any transcriptional errors that result from this process are unintentional.

## 2023-12-14 ENCOUNTER — Ambulatory Visit: Admitting: Family Medicine

## 2023-12-14 ENCOUNTER — Encounter: Payer: Self-pay | Admitting: Family Medicine

## 2023-12-14 VITALS — BP 112/76 | HR 93 | Ht 62.0 in | Wt 131.0 lb

## 2023-12-14 DIAGNOSIS — M9904 Segmental and somatic dysfunction of sacral region: Secondary | ICD-10-CM | POA: Diagnosis not present

## 2023-12-14 DIAGNOSIS — M9901 Segmental and somatic dysfunction of cervical region: Secondary | ICD-10-CM

## 2023-12-14 DIAGNOSIS — M9908 Segmental and somatic dysfunction of rib cage: Secondary | ICD-10-CM | POA: Diagnosis not present

## 2023-12-14 DIAGNOSIS — M545 Low back pain, unspecified: Secondary | ICD-10-CM

## 2023-12-14 DIAGNOSIS — M9902 Segmental and somatic dysfunction of thoracic region: Secondary | ICD-10-CM

## 2023-12-14 DIAGNOSIS — M9903 Segmental and somatic dysfunction of lumbar region: Secondary | ICD-10-CM

## 2023-12-14 NOTE — Assessment & Plan Note (Signed)
 Low back is doing really well overall.  Discussed icing regimen and home exercises.  Can follow-up with me again in 3 to 6 months.  Patient is doing well with the conservative therapy.

## 2023-12-14 NOTE — Patient Instructions (Signed)
 See me at the end of the year Happy Holidays

## 2023-12-18 DIAGNOSIS — Z0000 Encounter for general adult medical examination without abnormal findings: Secondary | ICD-10-CM | POA: Diagnosis not present

## 2024-02-04 DIAGNOSIS — R1013 Epigastric pain: Secondary | ICD-10-CM | POA: Diagnosis not present

## 2024-02-19 NOTE — Progress Notes (Deleted)
" °  [facility] [address] [place] [address] Phone: [phone] Subjective:    I'm seeing this patient by the request  of:  Chrystal Lamarr RAMAN, MD  CC:   YEP:Dlagzrupcz  Tiffany Myers is a 58 y.o. female coming in with complaint of back and neck pain. OMT 12/14/2023. Patient states   Medications patient has been prescribed: None  Taking:         Reviewed prior external information including notes and imaging from previsou exam, outside providers and external EMR if available.   As well as notes that were available from care everywhere and other healthcare systems.  Past medical history, social, surgical and family history all reviewed in electronic medical record.  No pertanent information unless stated regarding to the chief complaint.   Past Medical History:  Diagnosis Date   Hepatic adenomas     Allergies[1]   Review of Systems:  No headache, visual changes, nausea, vomiting, diarrhea, constipation, dizziness, abdominal pain, skin rash, fevers, chills, night sweats, weight loss, swollen lymph nodes, body aches, joint swelling, chest pain, shortness of breath, mood changes. POSITIVE muscle aches  Objective  Last menstrual period 06/16/2020, unknown if currently breastfeeding.   General: No apparent distress alert and oriented x3 mood and affect normal, dressed appropriately.  HEENT: Pupils equal, extraocular movements intact  Respiratory: Patient's speak in full sentences and does not appear short of breath  Cardiovascular: No lower extremity edema, non tender, no erythema  Gait MSK:  Back   Osteopathic findings  C2 flexed rotated and side bent right C6 flexed rotated and side bent left T3 extended rotated and side bent right inhaled rib T9 extended rotated and side bent left L2 flexed rotated and side bent right Sacrum right on right       Assessment and Plan:  No problem-specific Assessment & Plan notes  found for this encounter.    Nonallopathic problems  Decision today to treat with OMT was based on Physical Exam  After verbal consent patient was treated with HVLA, ME, FPR techniques in cervical, rib, thoracic, lumbar, and sacral  areas  Patient tolerated the procedure well with improvement in symptoms  Patient given exercises, stretches and lifestyle modifications  See medications in patient instructions if given  Patient will follow up in 4-8 weeks             Note: This dictation was prepared with Dragon dictation along with smaller phrase technology. Any transcriptional errors that result from this process are unintentional.            [1]  Allergies Allergen Reactions   Doxycycline Rash   Penicillins Rash   Sulfa Antibiotics Rash   "

## 2024-02-25 ENCOUNTER — Ambulatory Visit: Admitting: Family Medicine

## 2024-03-03 ENCOUNTER — Encounter: Payer: Self-pay | Admitting: Physician Assistant

## 2024-03-13 ENCOUNTER — Other Ambulatory Visit: Payer: Self-pay | Admitting: Surgery

## 2024-03-13 DIAGNOSIS — D134 Benign neoplasm of liver: Secondary | ICD-10-CM

## 2024-03-13 DIAGNOSIS — K7689 Other specified diseases of liver: Secondary | ICD-10-CM

## 2024-03-27 NOTE — Progress Notes (Signed)
 "  Chief Complaint: Abdominal pain and GERD  HPI:    Tiffany Myers is a 59 year old Caucasian female with a past medical history as listed below including hepatic adenomas, known to Dr. Avram, who was referred to me by Tiffany Lamarr RAMAN, MD for a complaint of abdominal pain and GERD.      09/16/2020 office visit with Dr. Avram for hepatic adenomas.  At that time noted to have liver lesions and diagnosed after alk phos was noted to be elevated and MRI raise the possibility of malignancy.  At that point followed by Dr. Dasie of hepatobiliary surgery.  Obtaining records from colonoscopy.  (Patient reported negative back in 2018 will need to repeat in 2028).    04/15/2023 MRI of the liver with and without contrast showed multiple arterially hyperenhancing lesions throughout the liver, largest lesion slightly diminished in size compared to prior exam.  Consistent with multiple hepatic adenomata, cholelithiasis.    02/04/2024 patient seen by general surgery for follow-up.  At that time discussing possible gallbladder symptoms.  Noted to been following with them for hepatic adenomas persistently regressing in size after stopping oral contraceptives.  At that time noted 3 weeks of intermittent pain in the umbilical area and epigastrium not always related to eating.  1 episode of burning pain which radiated up into her chest.  At that point recommended trial of an H2 blocker at night.  Noted to be due for her next MRI of the liver in February.    Today, patient presents to clinic and tells me that since mid November she has had some reflux and epigastric pain.  Initially tried Pepcid AC but this caused diarrhea.  Eventually went on a 14-day 20 mg a pack of Prilosec daily which helped with her regurgitation but she still has some discomfort in her epigastrium.  She tells me it feels like a squeeze, but not a cramp.  Sometimes it will wake her in the night and feel like a gnawing sensation.  She is on Fosamax  but stopped this for about a month and felt like maybe this helped her symptoms as well, she just restarted 3 weeks ago and again is feeling the epigastric discomfort.  She wonders if she has a hiatal hernia.  Apparently had also started lifting some heavier weights when all this occurred and after she lifts weight she feels a sensation in there like a soreness.    Chronically battles with alternating bowel habits her whole life, this has not changed, maybe slightly more constipation with menopause.  Still has daily bowel movements.    Reminds me that she is due for an MRI in February.       Denies fever, chills, weight loss, range in bowel habits, blood in her stool or symptoms that awaken her from sleep.   Past Medical History:  Diagnosis Date   Hepatic adenomas     Past Surgical History:  Procedure Laterality Date   COLONOSCOPY  12/28/2016   LIVER BIOPSY      Current Outpatient Medications  Medication Sig Dispense Refill   Calcium Carb-Cholecalciferol (CALCIUM 600 + D PO) Take 2 tablets by mouth daily.     Cholecalciferol (VITAMIN D) 50 MCG (2000 UT) tablet Take 2,000 Units by mouth daily.     fluticasone (FLONASE) 50 MCG/ACT nasal spray Place 1 spray into both nostrils daily as needed for allergies or rhinitis.     gabapentin  (NEURONTIN ) 100 MG capsule Take 2 capsules (200 mg total) by mouth  at bedtime. 180 capsule 0   ibuprofen (ADVIL) 200 MG tablet Take 400 mg by mouth every 6 (six) hours as needed for headache or moderate pain.     predniSONE  (DELTASONE ) 20 MG tablet Take 2 tablets (40 mg total) by mouth daily with breakfast. 10 tablet 0   No current facility-administered medications for this visit.    Allergies as of 03/28/2024 - Review Complete 12/14/2023  Allergen Reaction Noted   Doxycycline Rash 07/15/2020   Penicillins Rash 01/08/2017   Sulfa antibiotics Rash 07/08/2020    Family History  Problem Relation Age of Onset   Cirrhosis Father     Social History    Socioeconomic History   Marital status: Married    Spouse name: Not on file   Number of children: Not on file   Years of education: Not on file   Highest education level: Not on file  Occupational History   Not on file  Tobacco Use   Smoking status: Never   Smokeless tobacco: Never  Vaping Use   Vaping status: Never Used  Substance and Sexual Activity   Alcohol use: Yes    Comment: social   Drug use: Never   Sexual activity: Not on file  Other Topics Concern   Not on file  Social History Narrative   Patient is married she has 1 son and 1 daughter   She works part-time at a health [employer]   Never smoker 1 alcoholic drink per day 0-1 caffeinated beverages a day no drug use no other tobacco   Social Drivers of Health   Tobacco Use: Low Risk  (02/04/2024)   Received from [facility]   Patient History    Smoking Tobacco Use: Never    Smokeless Tobacco Use: Never    Passive Exposure: Not on file  Financial Resource Strain: Not on file  Food Insecurity: Not on file  Transportation Needs: Not on file  Physical Activity: Not on file  Stress: Not on file  Social Connections: Not on file  Intimate Partner Violence: Not on file  Depression (EYV7-0): Not on file  Alcohol Screen: Not on file  Housing: Unknown (02/04/2024)   Received from [facility]   Epic    Unable to Pay for Housing in the Last Year: Not on file    Number of Times Moved in the Last Year: Not on file    At any time in the past 12 months, were you homeless or living in a shelter (including now)?: No  Utilities: Not on file  Health Literacy: Not on file    Review of Systems:    Constitutional: No weight loss, fever or chills Skin: No rash Cardiovascular: No chest pain Respiratory: No SOB  Gastrointestinal: See HPI and otherwise negative Genitourinary: No dysuria  Neurological: No headache, dizziness or syncope Musculoskeletal: No new muscle or joint  pain Hematologic: No bleeding  Psychiatric: No history of depression or anxiety   Physical Exam:  Vital signs: BP 122/64   Pulse 84   Ht 5' 2 (1.575 m)   Wt 136 lb 8 oz (61.9 kg)   LMP 06/16/2020 Comment: birth control pill  BMI 24.97 kg/m    Constitutional:   Pleasant Caucasian female appears to be in NAD, Well developed, Well nourished, alert and cooperative Head:  Normocephalic and atraumatic. Eyes:   PEERL, EOMI. No icterus. Conjunctiva pink. Ears:  Normal auditory acuity. Neck:  Supple Throat: Oral cavity and pharynx without inflammation,  swelling or lesion.  Respiratory: Respirations even and unlabored. Lungs clear to auscultation bilaterally.   No wheezes, crackles, or rhonchi.  Cardiovascular: Normal S1, S2. No MRG. Regular rate and rhythm. No peripheral edema, cyanosis or pallor.  Gastrointestinal:  Soft, nondistended, mild epigastric TTP, no rebound or guarding. Normal bowel sounds. No appreciable masses or hepatomegaly. Rectal:  Not performed.  Msk:  Symmetrical without gross deformities. Without edema, no deformity or joint abnormality.  Neurologic:  Alert and  oriented x4;  grossly normal neurologically.  Skin:   Dry and intact without significant lesions or rashes. Psychiatric: Demonstrates good judgement and reason without abnormal affect or behaviors.  See HPI for most recent imaging.  Assessment: 1.  Epigastric pain/GERD: For the past 3 months now epigastric discomfort that feels like a squeezing, sometimes worse with eating, as well as some acid regurgitation, regurgitation help by 14 days of 20 mg of Prilosec, but discomfort still there, she is on Fosamax which seems to irritate symptoms; consider gastritis +/- GERD +/- hiatal hernia 2.  History of hepatic adenomas: Follows with general surgery with regular surveillance imaging, hepatic adenomas have been shrinking in size since she stopped her oral contraceptive 3.  Screening for colorectal cancer: Per patient  reports she is due for a colonoscopy in 2028  Plan: 1.  Discussed options with the patient including conservative management with medication versus EGD to start.  She would like to go ahead and start on Omeprazole  40 mg twice daily, 30 minutes for breakfast #60 with 3 refills for the next couple of months.  Also wants to get scheduled for an EGD in case this does not help. 2.  Scheduled patient for diagnostic EGD in the [facility] with Dr. Avram in 2 months.  Did provide the patient with a detailed list of risks for procedure and she agrees to proceed. Patient is appropriate for endoscopic procedure(s) in the ambulatory ([facility]) setting.  3.  Reviewed antireflux diet and lifestyle modifications 4.  Discussed with patient that if the medication helps after a month she can then titrate down to once daily dosing and then titrate to every other day and stop. 5.  Patient will hold her Fosamax during this time and can restart afterwards if she would like 6.  Patient to follow in clinic per recommendations after EGD  Tiffany Failing, PA-C [facility] 03/27/2024, 9:46 AM  Cc: Tiffany Lamarr RAMAN, MD  "

## 2024-03-28 ENCOUNTER — Ambulatory Visit: Admitting: Physician Assistant

## 2024-03-28 ENCOUNTER — Encounter: Payer: Self-pay | Admitting: Physician Assistant

## 2024-03-28 VITALS — BP 122/64 | HR 84 | Ht 62.0 in | Wt 136.5 lb

## 2024-03-28 DIAGNOSIS — Z86018 Personal history of other benign neoplasm: Secondary | ICD-10-CM

## 2024-03-28 DIAGNOSIS — R1013 Epigastric pain: Secondary | ICD-10-CM | POA: Diagnosis not present

## 2024-03-28 DIAGNOSIS — K219 Gastro-esophageal reflux disease without esophagitis: Secondary | ICD-10-CM

## 2024-03-28 MED ORDER — OMEPRAZOLE 40 MG PO CPDR: 40.0000 mg | DELAYED_RELEASE_CAPSULE | Freq: Two times a day (BID) | ORAL | 5 refills | Status: AC

## 2024-03-28 NOTE — Patient Instructions (Addendum)
 We have sent the following medications to your pharmacy for you to pick up at your convenience: Omeprazole  40 mg twice daily.    You have been scheduled for an endoscopy. Please follow written instructions given to you at your visit today.  If you use inhalers (even only as needed), please bring them with you on the day of your procedure.  If you take any of the following medications, they will need to be adjusted prior to your procedure:   DO NOT TAKE 7 DAYS PRIOR TO TEST- Trulicity (dulaglutide) Ozempic, Wegovy (semaglutide) Mounjaro, Zepbound (tirzepatide) Bydureon Bcise (exanatide extended release)  DO NOT TAKE 1 DAY PRIOR TO YOUR TEST Rybelsus (semaglutide) Adlyxin (lixisenatide) Victoza (liraglutide) Byetta (exanatide) ___________________________________________________________________________   Due to recent changes in healthcare laws, you may see the results of your imaging and laboratory studies on MyChart before your provider has had a chance to review them.  We understand that in some cases there may be results that are confusing or concerning to you. Not all laboratory results come back in the same time frame and the provider may be waiting for multiple results in order to interpret others.  Please give us  48 hours in order for your provider to thoroughly review all the results before contacting the office for clarification of your results.   _______________________________________________________  If your blood pressure at your visit was 140/90 or greater, please contact your primary care physician to follow up on this.  _______________________________________________________  If you are age 49 or older, your body mass index should be between 23-30. Your Body mass index is 24.97 kg/m. If this is out of the aforementioned range listed, please consider follow up with your Primary Care Provider.  If you are age 27 or younger, your body mass index should be between 19-25.  Your Body mass index is 24.97 kg/m. If this is out of the aformentioned range listed, please consider follow up with your Primary Care Provider.   ________________________________________________________  The [facility] providers would like to encourage you to use MYCHART to communicate with providers for non-urgent requests or questions.  Due to long hold times on the telephone, sending your provider a message by [facility] may be a faster and more efficient way to get a response.  Please allow 48 business hours for a response.  Please remember that this is for non-urgent requests.  _______________________________________________________  [facility] is using a team-based approach to care.  Your team is made up of your doctor and two to three APPS. Our APPS (Nurse Practitioners and Physician Assistants) work with your physician to ensure care continuity for you. They are fully qualified to address your health concerns and develop a treatment plan. They communicate directly with your gastroenterologist to care for you. Seeing the Advanced Practice Practitioners on your physician's team can help you by facilitating care more promptly, often allowing for earlier appointments, access to diagnostic testing, procedures, and other specialty referrals.   Thank you for choosing me and [facility].  Delon Failing, PA-C

## 2024-04-02 NOTE — Progress Notes (Unsigned)
" °  [facility] [address] [place] [address] Phone: [phone] Subjective:   Tiffany Myers, am serving as a scribe for Dr. Arthea Myers.  I'm seeing this patient by the request  of:  Tiffany Lamarr RAMAN, MD  CC: Back and neck pain follow-up  YEP:Dlagzrupcz  Tiffany Myers is a 59 y.o. female coming in with complaint of back and neck pain. OMT on 12/14/2023. Patient states doing well. No new symptoms. Same per usual.  Medications patient has been prescribed:   Taking:     Patient is scheduled for another MRI of the liver with and without contrast on February 16.  Following up with GI.  Findings have been consistent with multiple hepatic adenomas does have some cholelithiasis as well    Reviewed prior external information including notes and imaging from previsou exam, outside providers and external EMR if available.   As well as notes that were available from care everywhere and other healthcare systems.  Past medical history, social, surgical and family history all reviewed in electronic medical record.  No pertanent information unless stated regarding to the chief complaint.   Past Medical History:  Diagnosis Date   Gallstones    Hepatic adenomas     Allergies[1]   Review of Systems:  No headache, visual changes, nausea, vomiting, diarrhea, constipation, dizziness, abdominal pain, skin rash, fevers, chills, night sweats, weight loss, swollen lymph nodes, body aches, joint swelling, chest pain, shortness of breath, mood changes. POSITIVE muscle aches  Objective  Blood pressure 120/74, pulse 89, height 5' 2 (1.575 m), weight 136 lb (61.7 kg), last menstrual period 06/16/2020, SpO2 99%, unknown if currently breastfeeding.   General: No apparent distress alert and oriented x3 mood and affect normal, dressed appropriately.  HEENT: Pupils equal, extraocular movements intact  Respiratory: Patient's speak in full sentences and does not  appear short of breath  Cardiovascular: No lower extremity edema, non tender, no erythema  Gait relatively normal MSK:  Back low back does have some loss lordosis noted but very minimal.  Some pain in the thoracolumbar juncture but nothing also significant.  Tightness noted with mild sidebending.  Osteopathic findings  T9 extended rotated and side bent left L2 flexed rotated and side bent right Sacrum right on right     Assessment and Plan:  Low back pain Patient is doing very well.  Continue to work on her core strengthening which patient is doing remarkably well.  Given some mild home exercises, responding well to osteopathic manipulation after further evaluation.  Follow-up again in 6 to 8 weeks.    Nonallopathic problems  Decision today to treat with OMT was based on Physical Exam  After verbal consent patient was treated with HVLA, ME, FPR techniques in cervical, rib, thoracic, lumbar, and sacral  areas  Patient tolerated the procedure well with improvement in symptoms  Patient given exercises, stretches and lifestyle modifications  See medications in patient instructions if given  Patient will follow up in 4-8 weeks    The above documentation has been reviewed and is accurate and complete Tiffany Salminen M Shakeila Pfarr, DO          Note: This dictation was prepared with Dragon dictation along with smaller phrase technology. Any transcriptional errors that result from this process are unintentional.            [1]  Allergies Allergen Reactions   Doxycycline Rash   Penicillins Rash   Sulfa Antibiotics Rash   "

## 2024-04-03 ENCOUNTER — Encounter: Payer: Self-pay | Admitting: Family Medicine

## 2024-04-03 ENCOUNTER — Ambulatory Visit: Admitting: Family Medicine

## 2024-04-03 VITALS — BP 120/74 | HR 89 | Ht 62.0 in | Wt 136.0 lb

## 2024-04-03 DIAGNOSIS — M9903 Segmental and somatic dysfunction of lumbar region: Secondary | ICD-10-CM

## 2024-04-03 DIAGNOSIS — M545 Low back pain, unspecified: Secondary | ICD-10-CM | POA: Diagnosis not present

## 2024-04-03 DIAGNOSIS — M9904 Segmental and somatic dysfunction of sacral region: Secondary | ICD-10-CM

## 2024-04-03 DIAGNOSIS — M9902 Segmental and somatic dysfunction of thoracic region: Secondary | ICD-10-CM

## 2024-04-03 NOTE — Patient Instructions (Signed)
 Good to see you! See you again in 3-4 months

## 2024-04-03 NOTE — Assessment & Plan Note (Signed)
 Patient is doing very well.  Continue to work on her core strengthening which patient is doing remarkably well.  Given some mild home exercises, responding well to osteopathic manipulation after further evaluation.  Follow-up again in 6 to 8 weeks.

## 2024-04-12 ENCOUNTER — Other Ambulatory Visit

## 2024-04-14 ENCOUNTER — Other Ambulatory Visit

## 2024-05-20 ENCOUNTER — Encounter: Admitting: Internal Medicine

## 2024-07-25 ENCOUNTER — Ambulatory Visit: Admitting: Family Medicine
